# Patient Record
Sex: Male | Born: 1950 | Race: White | Hispanic: No | Marital: Married | Smoking: Never smoker
Health system: Southern US, Community
[De-identification: ages and names within clinical notes are randomized; demographics above are authoritative.]

## PROBLEM LIST (undated history)

## (undated) DIAGNOSIS — Z87442 Personal history of urinary calculi: Secondary | ICD-10-CM

## (undated) DIAGNOSIS — J189 Pneumonia, unspecified organism: Secondary | ICD-10-CM

## (undated) DIAGNOSIS — J45909 Unspecified asthma, uncomplicated: Secondary | ICD-10-CM

## (undated) HISTORY — PX: OTHER SURGICAL HISTORY: SHX169

## (undated) HISTORY — PX: COLONOSCOPY: SHX174

---

## 1998-05-23 ENCOUNTER — Emergency Department (HOSPITAL_COMMUNITY): Admission: EM | Admit: 1998-05-23 | Discharge: 1998-05-23 | Payer: Self-pay | Admitting: Emergency Medicine

## 2005-01-13 ENCOUNTER — Emergency Department (HOSPITAL_COMMUNITY): Admission: EM | Admit: 2005-01-13 | Discharge: 2005-01-13 | Payer: Self-pay | Admitting: Emergency Medicine

## 2005-01-21 ENCOUNTER — Ambulatory Visit: Payer: Self-pay | Admitting: Internal Medicine

## 2005-01-25 ENCOUNTER — Emergency Department (HOSPITAL_COMMUNITY): Admission: EM | Admit: 2005-01-25 | Discharge: 2005-01-25 | Payer: Self-pay | Admitting: Emergency Medicine

## 2005-02-01 ENCOUNTER — Ambulatory Visit: Payer: Self-pay | Admitting: Internal Medicine

## 2005-02-25 ENCOUNTER — Ambulatory Visit: Payer: Self-pay | Admitting: Internal Medicine

## 2013-06-22 ENCOUNTER — Emergency Department (HOSPITAL_COMMUNITY): Payer: Worker's Compensation

## 2013-06-22 ENCOUNTER — Emergency Department (HOSPITAL_COMMUNITY)
Admission: EM | Admit: 2013-06-22 | Discharge: 2013-06-23 | Disposition: A | Discharge status: Home / Self Care | Payer: Worker's Compensation | Attending: Emergency Medicine | Admitting: Emergency Medicine

## 2013-06-22 ENCOUNTER — Encounter (HOSPITAL_COMMUNITY): Payer: Self-pay | Admitting: Emergency Medicine

## 2013-06-22 DIAGNOSIS — S61409A Unspecified open wound of unspecified hand, initial encounter: Secondary | ICD-10-CM | POA: Insufficient documentation

## 2013-06-22 DIAGNOSIS — Y9389 Activity, other specified: Secondary | ICD-10-CM | POA: Insufficient documentation

## 2013-06-22 DIAGNOSIS — Z23 Encounter for immunization: Secondary | ICD-10-CM | POA: Insufficient documentation

## 2013-06-22 DIAGNOSIS — J45909 Unspecified asthma, uncomplicated: Secondary | ICD-10-CM | POA: Insufficient documentation

## 2013-06-22 DIAGNOSIS — W268XXA Contact with other sharp object(s), not elsewhere classified, initial encounter: Secondary | ICD-10-CM | POA: Insufficient documentation

## 2013-06-22 DIAGNOSIS — Y929 Unspecified place or not applicable: Secondary | ICD-10-CM | POA: Insufficient documentation

## 2013-06-22 DIAGNOSIS — S61411A Laceration without foreign body of right hand, initial encounter: Secondary | ICD-10-CM

## 2013-06-22 HISTORY — DX: Unspecified asthma, uncomplicated: J45.909

## 2013-06-22 MED ORDER — TETANUS-DIPHTH-ACELL PERTUSSIS 5-2.5-18.5 LF-MCG/0.5 IM SUSP
0.5000 mL | Freq: Once | INTRAMUSCULAR | Status: AC
  Administered 2013-06-22: 0.5 mL via INTRAMUSCULAR
  Filled 2013-06-22: qty 0.5

## 2013-06-22 NOTE — ED Notes (Signed)
MD at bedside. 

## 2013-06-22 NOTE — ED Provider Notes (Signed)
CSN: [id number]     Arrival date & time 06/22/13  2218 History   First MD Initiated Contact with Patient 06/22/13 2303     Chief Complaint  Patient presents with  . Extremity Laceration     (Consider location/radiation/quality/duration/timing/severity/associated sxs/prior Treatment) The history is provided by the patient and medical records. No language interpreter was used.    Gregory Mcdowell is a 63 y.o. male  with a hx of asthma presents to the Emergency Department complaining of acute laceration to the right hand occuring 1 hour PTA.  Pt reports he was working on a shower when he cut his hand on a piece of metal he couldn't see.  He reports his tetanus is not UTD. Associated symptoms include pain at the site.  Hemostasis achieved prior to arrival. Nothing makes it better or worse.  Pt denies fever, chills, numbness, weakness.    Past Medical History  Diagnosis Date  . Asthma    Past Surgical History  Procedure Laterality Date  . Leg injury     History reviewed. No pertinent family history. History  Substance Use Topics  . Smoking status: Never Smoker   . Smokeless tobacco: Never Used  . Alcohol Use: Yes     Comment: ocassionally    Review of Systems  Constitutional: Negative for fever.  Gastrointestinal: Negative for nausea and vomiting.  Skin: Positive for wound.  Allergic/Immunologic: Negative for immunocompromised state.  Neurological: Negative for weakness and numbness.  Hematological: Does not bruise/bleed easily.  Psychiatric/Behavioral: The patient is not nervous/anxious.       Allergies  Review of patient's allergies indicates no known allergies.  Home Medications  No current outpatient prescriptions on file. BP 150/90  Pulse 68  Temp(Src) 97.5 F (36.4 C) (Oral)  Resp 18  Wt 232 lb 4.8 oz (105.371 kg)  SpO2 95% Physical Exam  Nursing note and vitals reviewed. Constitutional: He is oriented to person, place, and time. He appears well-developed and  well-nourished. No distress.  HENT:  Head: Normocephalic and atraumatic.  Eyes: Conjunctivae are normal. No scleral icterus.  Neck: Normal range of motion.  Cardiovascular: Normal rate, regular rhythm, normal heart sounds and intact distal pulses.   No murmur heard. Capillary refill < 3 sec  Pulmonary/Chest: Effort normal and breath sounds normal. No respiratory distress.  Musculoskeletal: Normal range of motion. He exhibits no edema.  ROM: full ROM of all fingers of the right hand  Neurological: He is alert and oriented to person, place, and time.  Sensation: intact to dull and sharp Strength: 4/5 strength in the thumb with resisted flexion and extension  Skin: Skin is warm and dry. He is not diaphoretic.  8cm laceration to the right thenar eminence - very deep 6cm laceration to the medial side of the palm   Psychiatric: He has a normal mood and affect.    ED Course  Procedures (including critical care time) Labs Review Labs Reviewed - No data to display Imaging Review No results found.  EKG Interpretation   None       MDM   Final diagnoses:  None    [name] presents with large laceration to the thenar eminence and to the medial palm.  X-ray without foreign body or fracture.  Tetanus updated.  I personally reviewed the imaging tests through PACS system  I reviewed available ER/hospitalization records through the EMR  Discussed with Dr. Amanda Pea who will repair the laceration tonight in the ED.  Pain control given and  Ancef 2g IV.    Pt to be d/c with percocet and keflex.    It has been determined that no acute conditions requiring further emergency intervention are present at this time. The patient/guardian have been advised of the diagnosis and plan. We have discussed signs and symptoms that warrant return to the ED, such as changes or worsening in symptoms.   Vital signs are stable at discharge.   BP 150/90  Pulse 68  Temp(Src) 97.5 F (36.4 C) (Oral)   Resp 18  Wt 232 lb 4.8 oz (105.371 kg)  SpO2 95%  Patient/guardian has voiced understanding and agreed to follow-up with the PCP or specialist.        Dierdre ForthHannah Shirrell Solinger, PA-C 06/23/13 0250

## 2013-06-22 NOTE — ED Notes (Signed)
Patient was working on a shower and cut his right hand  Large laceration to base of right thumb (moves thumb well) and laceration to the palm of his right hand

## 2013-06-23 ENCOUNTER — Encounter (HOSPITAL_COMMUNITY): Payer: Self-pay

## 2013-06-23 MED ORDER — OXYCODONE-ACETAMINOPHEN 5-325 MG PO TABS: 1.0000 | ORAL_TABLET | ORAL | Status: DC | PRN

## 2013-06-23 MED ORDER — CEPHALEXIN 500 MG PO CAPS: 500.0000 mg | ORAL_CAPSULE | Freq: Four times a day (QID) | ORAL | Status: DC

## 2013-06-23 MED ORDER — OXYCODONE-ACETAMINOPHEN 5-325 MG PO TABS
ORAL_TABLET | ORAL | Status: AC
  Filled 2013-06-23: qty 1

## 2013-06-23 NOTE — Consult Note (Signed)
  See Dictation #[id number] Amanda PeaGramig MD

## 2013-06-23 NOTE — ED Notes (Signed)
See downtime charting. 

## 2013-06-23 NOTE — ED Notes (Signed)
Per C.C. OR RN: patient hand laceration to right hand. Cleaned normal saline repair muscle tendon and nerve. Repair sutures to close laceration. per Md. Protocol. Assisted Dr. Leata MouseGramgic with this minor procedure @bedside . Dressing xeroform,4x4,kerlix, and splint applied.  Patient tolerated well with out difficulty.

## 2013-06-23 NOTE — Discharge Instructions (Signed)
1. Medications: percocet, keflex, usual home medications 2. Treatment: rest, drink plenty of fluids,  3. Follow Up: Please followup with Dr. Amanda Pea as directed  Laceration Care, Adult A laceration is a cut or lesion that goes through all layers of the skin and into the tissue just beneath the skin. TREATMENT  Some lacerations may not require closure. Some lacerations may not be able to be closed due to an increased risk of infection. It is important to see your caregiver as soon as possible after an injury to minimize the risk of infection and maximize the opportunity for successful closure. If closure is appropriate, pain medicines may be given, if needed. The wound will be cleaned to help prevent infection. Your caregiver will use stitches (sutures), staples, wound glue (adhesive), or skin adhesive strips to repair the laceration. These tools bring the skin edges together to allow for faster healing and a better cosmetic outcome. However, all wounds will heal with a scar. Once the wound has healed, scarring can be minimized by covering the wound with sunscreen during the day for 1 full year. HOME CARE INSTRUCTIONS  For sutures or staples:  Keep the wound clean and dry.  If you were given a bandage (dressing), you should change it at least once a day. Also, change the dressing if it becomes wet or dirty, or as directed by your caregiver.  Wash the wound with soap and water 2 times a day. Rinse the wound off with water to remove all soap. Pat the wound dry with a clean towel.  After cleaning, apply a thin layer of the antibiotic ointment as recommended by your caregiver. This will help prevent infection and keep the dressing from sticking.  You may shower as usual after the first 24 hours. Do not soak the wound in water until the sutures are removed.  Only take over-the-counter or prescription medicines for pain, discomfort, or fever as directed by your caregiver.  Get your sutures or staples  removed as directed by your caregiver. For skin adhesive strips:  Keep the wound clean and dry.  Do not get the skin adhesive strips wet. You may bathe carefully, using caution to keep the wound dry.  If the wound gets wet, pat it dry with a clean towel.  Skin adhesive strips will fall off on their own. You may trim the strips as the wound heals. Do not remove skin adhesive strips that are still stuck to the wound. They will fall off in time. For wound adhesive:  You may briefly wet your wound in the shower or bath. Do not soak or scrub the wound. Do not swim. Avoid periods of heavy perspiration until the skin adhesive has fallen off on its own. After showering or bathing, gently pat the wound dry with a clean towel.  Do not apply liquid medicine, cream medicine, or ointment medicine to your wound while the skin adhesive is in place. This may loosen the film before your wound is healed.  If a dressing is placed over the wound, be careful not to apply tape directly over the skin adhesive. This may cause the adhesive to be pulled off before the wound is healed.  Avoid prolonged exposure to sunlight or tanning lamps while the skin adhesive is in place. Exposure to ultraviolet light in the first year will darken the scar.  The skin adhesive will usually remain in place for 5 to 10 days, then naturally fall off the skin. Do not pick at the adhesive  film. You may need a tetanus shot if:  You cannot remember when you had your last tetanus shot.  You have never had a tetanus shot. If you get a tetanus shot, your arm may swell, get red, and feel warm to the touch. This is common and not a problem. If you need a tetanus shot and you choose not to have one, there is a rare chance of getting tetanus. Sickness from tetanus can be serious. SEEK MEDICAL CARE IF:   You have redness, swelling, or increasing pain in the wound.  You see a red line that goes away from the wound.  You have  yellowish-white fluid (pus) coming from the wound.  You have a fever.  You notice a bad smell coming from the wound or dressing.  Your wound breaks open before or after sutures have been removed.  You notice something coming out of the wound such as wood or glass.  Your wound is on your hand or foot and you cannot move a finger or toe. SEEK IMMEDIATE MEDICAL CARE IF:   Your pain is not controlled with prescribed medicine.  You have severe swelling around the wound causing pain and numbness or a change in color in your arm, hand, leg, or foot.  Your wound splits open and starts bleeding.  You have worsening numbness, weakness, or loss of function of any joint around or beyond the wound.  You develop painful lumps near the wound or on the skin anywhere on your body. MAKE SURE YOU:   Understand these instructions.  Will watch your condition.  Will get help right away if you are not doing well or get worse. Document Released: 04/29/2005 Document Revised: 07/22/2011 Document Reviewed: 10/23/2010 [facility] Patient Information 2014 [place].  Keep bandage clean and dry.  Call for any problems.  No smoking.  Criteria for driving a car: you should be off your pain medicine for 7-8 hours, able to drive one handed(confident), thinking clearly and feeling able in your judgement to drive. Continue elevation as it will decrease swelling.  If instructed by MD move your fingers within the confines of the bandage/splint.  Use ice if instructed by your MD. Call immediately for any sudden loss of feeling in your hand/arm or change in functional abilities of the extremity.   We recommend that you to take vitamin C 1000 mg a day to promote healing we also recommend that if you require her pain medicine that he take a stool softener to prevent constipation as most pain medicines will have constipation side effects. We recommend either Peri-Colace or Senokot and recommend that you also consider  adding MiraLAX to prevent the constipation affects from pain medicine if you are required to use them. These medicines are over the counter and maybe purchased at a local pharmacy.

## 2013-06-23 NOTE — ED Provider Notes (Signed)
Medical screening examination/treatment/procedure(s) were conducted as a shared visit with non-physician practitioner(s) and myself.  I personally evaluated the patient during the encounter.  EKG Interpretation   None       Patient presents with deep laceration to the thenar imminence of the right hand. Full range of motion of the thumb with no numbness. Given extensive laceration hand surgery was consult it. He seen the patient in the emergency department and repaired the injury. Patient followup as an outpatient with Dr. Lenore MannerGramig  Gardy Krystopher Kuenzel, MD 06/23/13 [phone]

## 2013-06-24 NOTE — Consult Note (Signed)
NAMAlthea Charon:  Khouri, Whalen                 ACCOUNT NO.:  [id number]  MEDICAL RECORD NO.:  [id number]  LOCATION:  D34C                         FACILITY:  [facility]  PHYSICIAN:  Dionne AnoWilliam M. Dhaval Woo, M.D.DATE OF BIRTH:  [birth year]  DATE OF CONSULTATION: DATE OF DISCHARGE:  06/23/2013                                CONSULTATION   HISTORY OF PRESENT ILLNESS:  I had a pleasure to see Drue Stageravid Donelan today. He is a pleasant male, who presents with large laceration to his right hand.  The patient was referred in from his job place.  He works at the [employer].  He has severe laceration to his right hand in 2 different location.  He has exposed muscle tendon and nerve in the right thenar region and dorsal radial aspect of his right thumb.  He also has a hypothenar laceration.  The patient was given a tetanus shot and antibiotics in the form of 2 g of Ancef at my discussion and direction.  He is alert and oriented.  He denies neck, back, chest, or abdominal pain.  He denies other injury tonight.  I was called around 1 a.m. and promptly came in to see the patient.  PAST MEDICAL HISTORY:  Reviewed.  PAST SURGICAL HISTORY:  reviewed.  MEDICINES:  Reviewed.  ALLERGIES:  No known drug allergies.  SOCIAL HISTORY:  He is a nonsmoker.  He is married.  He is here with a co-worker.  He is alert and oriented.  REVIEW OF SYSTEMS:  A 14-point review of systems is reviewed.  He has no significant abnormalities on 14-point review of systems in terms of current health ailments or problems.  I have reviewed this at length.  PHYSICAL EXAMINATION:  NECK AND BACK:  Nontender. CHEST:  Clear. ABDOMEN:  Nontender and nondistended and soft.     Dionne AnoWilliam M. Amanda PeaGramig, M.D.     [facility]  D:  06/23/2013  T:  06/24/2013  Job:  [id number]

## 2013-06-24 NOTE — Op Note (Signed)
NAMAlthea Charon:  Bouffard, Kellyn                 ACCOUNT NO.:  [id number]  MEDICAL RECORD NO.:  [id number]  LOCATION:  D34C                         FACILITY:  [facility]  PHYSICIAN:  Dionne AnoWilliam M. Megon Kalina, M.D.DATE OF BIRTH:  August 26, 1950  DATE OF PROCEDURE: DATE OF DISCHARGE:  06/23/2013                              OPERATIVE REPORT   PREOPERATIVE DIAGNOSIS: 1. Thenar laceration, 10 cm, with exposed tendon, nerve, and muscle. 2. Hypothenar laceration, 4 cm, skin and subcutaneous tissue involved.  POSTOPERATIVE DIAGNOSIS: 1. Thenar laceration, 10 cm, with exposed tendon, nerve, and muscle. 2. Hypothenar laceration, 4 cm, skin and subcutaneous tissue involved.  PROCEDURE: 1. Irrigation and debridement of thenar laceration.  This was     excisional debridement of skin and subcutaneous tissue, muscle,     tendon, and associated soft tissues including periosteum. 2. Separate irrigation and debridement less than 20 sq. cm, skin and     subcutaneous tissue, hypothenar laceration 4 cm. 3. Abductor pollicis brevis tendon repair with 4-0 FiberWire. 4. Superficial radial nerve repair with 8-0 nylon utilizing 4.0 loupe     magnification. 5. Complex wound closure at thenar space, 10 cm. 6. Simple wound closure, 4 cm region, hypothenar aspect, right hand.  SURGEON:  Dionne AnoWilliam M. Amanda PeaGramig, M.D.  COMPLICATIONS:  None.  ANESTHESIA:  Local field and peripheral nerve block.  TOURNIQUET TIME:  Zero.  INDICATIONS:  Please see H and P.  Gregory Mcdowell had an on-Gregory-job injury with severe laceration.  He desires to proceed with Gregory above-mentioned operative intervention.  He understands risks and benefits of bleeding, infection, damage to normal structures, and failure of surgery to accomplish its intended goals of relieving symptoms and restoring function.  With this in mind, he desires to proceed.  All questions have been encouraged and answered preoperatively.  OPERATIVE PROCEDURE:  Gregory Mcdowell was seen, consented, and  evaluated extensively and following this, underwent a very careful and cautious field block and superficial radial nerve block.  Following this, I performed irrigation and debridement of skin, subcutaneous tissue, muscle, and tendon about Gregory thenar laceration, which was 10 cm, this was an excisional debridement.  Following this, we performed excisional debridement of skin and subcutaneous tissue, hypothenar laceration, 4 cm.  This was an excisional debridement of 20 sq. cm or less.  Both debridements occurred with curette scissor tip and knife blade, and this was an excisional debridement in both areas.  These were 2 different debridements involving different layers and complexities.  Following this, I was sure to place 3 L of saline through all areas to clean Gregory wounds promptly and aggressively.  Following this, we then repaired Gregory hypothenar laceration with 5-0 Prolene without difficulty, which he tolerated well.  This was a 4 cm simple repair.  Following this, I exposed Gregory abductor pollicis brevis tendon and I performed an 8-strand repair with FiberWire utilizing a modified Kessler- Tajima stitch.  This was done without difficulty and was tension free.  Following this, we exposed superficial radial nerve and performed epineurial repair with 8-0 nylon.  Gregory Mcdowell tolerated this beautifully.  I was able to coapt Gregory nerve ends.  I freshened them and then used [employer] scissor with  8-0 nylon to coapt Gregory nerve ends.  Following coapting Gregory nerve ends, I then very carefully placed a venous wrap around Gregory dorsal aspect to try to prevent sensitivity in Gregory future.  He tolerated this very well.  We irrigated copiously and following this, Gregory Mcdowell then underwent closure of Gregory wound with Prolene.  Gregory Mcdowell tolerated this nicely, and there were no complicating features. Once Gregory wound was closed, he was dressed with Xeroform gauze and a thumb spica splint.  He will be  discharged home on antibiotics, pain medicine, and return to see [place] in 12-14 days.  I am going to go ahead and mobilize Gregory thenar region for 4 weeks to allow Gregory tendon to heal and then I will begin progressive interval motion.  I have discussed with him I would expect hopefully no painful neuromas, but it is difficult to say how much sensory return he will get in Gregory superficial radial nerve and he understands this.  I would expect some degree of sensory deficit about Gregory radial aspect of Gregory thumb long-term given his age and Gregory injury level.  Hopefully, he will be able to regain his strength and abduction power.  He had excellent refill, was awake, alert, and oriented at Gregory time of discharge and awake, alert, and oriented during all points and parts of Gregory procedure.  We will look forward to seeing him back in Gregory office as discussed and all questions of course have been encouraged and answered.     Dionne Ano. Amanda Pea, M.D.     [facility]  D:  06/23/2013  T:  06/24/2013  Job:  [id number]

## 2014-01-17 ENCOUNTER — Encounter (HOSPITAL_COMMUNITY): Payer: Self-pay | Admitting: Emergency Medicine

## 2014-01-17 ENCOUNTER — Emergency Department (HOSPITAL_COMMUNITY)
Admission: EM | Admit: 2014-01-17 | Discharge: 2014-01-17 | Disposition: A | Discharge status: Home / Self Care | Payer: 59 | Attending: Emergency Medicine | Admitting: Emergency Medicine

## 2014-01-17 ENCOUNTER — Emergency Department (HOSPITAL_COMMUNITY): Payer: 59

## 2014-01-17 DIAGNOSIS — Y9289 Other specified places as the place of occurrence of the external cause: Secondary | ICD-10-CM | POA: Insufficient documentation

## 2014-01-17 DIAGNOSIS — Y9389 Activity, other specified: Secondary | ICD-10-CM | POA: Insufficient documentation

## 2014-01-17 DIAGNOSIS — R6883 Chills (without fever): Secondary | ICD-10-CM | POA: Insufficient documentation

## 2014-01-17 DIAGNOSIS — IMO0002 Reserved for concepts with insufficient information to code with codable children: Secondary | ICD-10-CM | POA: Insufficient documentation

## 2014-01-17 DIAGNOSIS — Z79899 Other long term (current) drug therapy: Secondary | ICD-10-CM | POA: Insufficient documentation

## 2014-01-17 DIAGNOSIS — S99919A Unspecified injury of unspecified ankle, initial encounter: Principal | ICD-10-CM

## 2014-01-17 DIAGNOSIS — S99929A Unspecified injury of unspecified foot, initial encounter: Principal | ICD-10-CM

## 2014-01-17 DIAGNOSIS — M25461 Effusion, right knee: Secondary | ICD-10-CM

## 2014-01-17 DIAGNOSIS — J45909 Unspecified asthma, uncomplicated: Secondary | ICD-10-CM | POA: Insufficient documentation

## 2014-01-17 DIAGNOSIS — S8990XA Unspecified injury of unspecified lower leg, initial encounter: Secondary | ICD-10-CM | POA: Insufficient documentation

## 2014-01-17 DIAGNOSIS — M25561 Pain in right knee: Secondary | ICD-10-CM

## 2014-01-17 LAB — SYNOVIAL CELL COUNT + DIFF, W/ CRYSTALS
CRYSTALS FLUID: NONE SEEN
CRYSTALS FLUID: NONE SEEN
Eosinophils-Synovial: NONE SEEN % (ref 0–1)
Lymphocytes-Synovial Fld: 11 % (ref 0–20)
Lymphocytes-Synovial Fld: 10 % (ref 0–20)
Monocyte-Macrophage-Synovial Fluid: 6 % — ABNORMAL LOW (ref 50–90)
Monocyte-Macrophage-Synovial Fluid: 6 % — ABNORMAL LOW (ref 50–90)
NEUTROPHIL, SYNOVIAL: 83 % — AB (ref 0–25)
NEUTROPHIL, SYNOVIAL: 84 % — AB (ref 0–25)
WBC, SYNOVIAL: UNDETERMINED /mm3 (ref 0–200)
WBC, Synovial: 312 /mm3 — ABNORMAL HIGH (ref 0–200)

## 2014-01-17 LAB — CBC WITH DIFFERENTIAL/PLATELET
BASOS ABS: 0 10*3/uL (ref 0.0–0.1)
BASOS PCT: 0 % (ref 0–1)
EOS ABS: 0.1 10*3/uL (ref 0.0–0.7)
EOS PCT: 1 % (ref 0–5)
HCT: 40.8 % (ref 39.0–52.0)
Hemoglobin: 14.4 g/dL (ref 13.0–17.0)
LYMPHS ABS: 1.1 10*3/uL (ref 0.7–4.0)
Lymphocytes Relative: 9 % — ABNORMAL LOW (ref 12–46)
MCH: 30.8 pg (ref 26.0–34.0)
MCHC: 35.3 g/dL (ref 30.0–36.0)
MCV: 87.4 fL (ref 78.0–100.0)
MONOS PCT: 10 % (ref 3–12)
Monocytes Absolute: 1.2 10*3/uL — ABNORMAL HIGH (ref 0.1–1.0)
NEUTROS ABS: 9.9 10*3/uL — AB (ref 1.7–7.7)
Neutrophils Relative %: 80 % — ABNORMAL HIGH (ref 43–77)
PLATELETS: 241 10*3/uL (ref 150–400)
RBC: 4.67 MIL/uL (ref 4.22–5.81)
RDW: 13 % (ref 11.5–15.5)
WBC: 12.4 10*3/uL — ABNORMAL HIGH (ref 4.0–10.5)

## 2014-01-17 LAB — BASIC METABOLIC PANEL
ANION GAP: 13 (ref 5–15)
BUN: 13 mg/dL (ref 6–23)
CALCIUM: 8.8 mg/dL (ref 8.4–10.5)
CHLORIDE: 98 meq/L (ref 96–112)
CO2: 24 mEq/L (ref 19–32)
Creatinine, Ser: 0.87 mg/dL (ref 0.50–1.35)
GFR calc Af Amer: >90 mL/min (ref >90)
GFR calc non Af Amer: >90 mL/min (ref >90)
GLUCOSE: 105 mg/dL — AB (ref 70–99)
Potassium: 4.8 mEq/L (ref 3.7–5.3)
SODIUM: 135 meq/L — AB (ref 137–147)

## 2014-01-17 LAB — GRAM STAIN
Special Requests: NORMAL
Special Requests: NORMAL

## 2014-01-17 LAB — SEDIMENTATION RATE: SED RATE: 18 mm/h — AB (ref 0–16)

## 2014-01-17 MED ORDER — BUTAMBEN-TETRACAINE-BENZOCAINE 2-2-14 % EX AERO
1.0000 | INHALATION_SPRAY | Freq: Once | CUTANEOUS | Status: DC
  Filled 2014-01-17: qty 56

## 2014-01-17 MED ORDER — DOXYCYCLINE HYCLATE 100 MG PO CAPS: 100.0000 mg | ORAL_CAPSULE | Freq: Two times a day (BID) | ORAL | Status: DC

## 2014-01-17 MED ORDER — OXYCODONE-ACETAMINOPHEN 5-325 MG PO TABS: 1.0000 | ORAL_TABLET | Freq: Four times a day (QID) | ORAL | Status: DC | PRN

## 2014-01-17 MED ORDER — OXYCODONE-ACETAMINOPHEN 5-325 MG PO TABS
1.0000 | ORAL_TABLET | Freq: Once | ORAL | Status: AC
  Administered 2014-01-17: 1 via ORAL
  Filled 2014-01-17: qty 1

## 2014-01-17 MED ORDER — NAPROXEN 500 MG PO TABS: 500.0000 mg | ORAL_TABLET | Freq: Two times a day (BID) | ORAL | Status: DC | PRN

## 2014-01-17 NOTE — ED Notes (Signed)
Per pt sts that 1 week ago he turned in bed and felt a pop in his right knee. Since progressively more pain.

## 2014-01-17 NOTE — ED Notes (Signed)
Second synovial joint fluid walked to lab; <51mL of red fluid noted in syringe; PA aware specimen may not be sufficient for testing

## 2014-01-17 NOTE — ED Provider Notes (Signed)
CSN: [id number]     Arrival date & time 01/17/14  1431 History   First MD Initiated Contact with Patient 01/17/14 1604     This chart was scribed for non-physician practitioner, Zacarias Pontes PA-C working with Orpah Greek, * by Forrestine Him, ED Scribe. This patient was seen in room TR11C/TR11C and the patient's care was started at 4:11 PM.   Chief Complaint  Patient presents with  . Knee Pain   Patient is a 63 y.o. male presenting with knee pain. The history is provided by the patient. No language interpreter was used.  Knee Pain Location:  Knee Time since incident:  1 week Knee location:  R knee Pain details:    Quality:  Sharp   Radiates to:  Does not radiate   Severity:  Moderate   Onset quality:  Sudden   Timing:  Constant   Progression:  Unchanged Chronicity:  New Dislocation: no   Foreign body present:  No foreign bodies Prior injury to area:  No Relieved by:  Rest Worsened by:  Extension and flexion Ineffective treatments:  None tried Associated symptoms: decreased ROM, stiffness and swelling   Associated symptoms: no fever, no muscle weakness, no numbness and no tingling     HPI Comments: Gregory Mcdowell is a 63 y.o. male with a PMHx of asthma who presents to the Emergency Department complaining of R knee pain x 1 week that is unchanged. He also reports associated warmth to the knee. Pt states he was in bed when he twisted his leg over resulting in him hearing a "pop". Now c/o constant, moderate, nonradiating pain and swelling to the knee as well as redness. Gregory Mcdowell describes pain as sharp with movement. Pain is exacerbated with movement/ambulation and alleviated with rest. He has tried OTC Ibuprofen with mild improvement for symptoms. Pt also reports mild chills last night. He denies any known fever, SOB, CP, nausea, vomiting, or abdominal pain. No numbness, paresthesia, or loss of sensation.  He denies a history of gout. Reports eating two double cheese  hamburgers from [employer]. No known skin injury to the area. He has no other pertinent past medical history. No other concerns this visit.   Past Medical History  Diagnosis Date  . Asthma    Past Surgical History  Procedure Laterality Date  . Leg injury     History reviewed. No pertinent family history. History  Substance Use Topics  . Smoking status: Never Smoker   . Smokeless tobacco: Never Used  . Alcohol Use: Yes     Comment: ocassionally    Review of Systems  Constitutional: Positive for chills. Negative for fever.  Respiratory: Negative for shortness of breath.   Cardiovascular: Negative for chest pain and leg swelling.  Gastrointestinal: Negative for nausea, vomiting, abdominal pain and diarrhea.  Musculoskeletal: Positive for arthralgias, gait problem (difficult to bear weight), joint swelling and stiffness. Negative for myalgias.  Skin: Positive for color change. Negative for wound.  Allergic/Immunologic: Negative for immunocompromised state.  Neurological: Negative for weakness and numbness.  Hematological: Does not bruise/bleed easily.  Psychiatric/Behavioral: Negative for confusion.  A complete 10 system review of systems was obtained and all systems are negative except as noted in the HPI and PMH.     Allergies  Review of patient's allergies indicates no known allergies.  Home Medications   Prior to Admission medications   Medication Sig Start Date End Date Taking? Authorizing Provider  cephALEXin (KEFLEX) 500 MG capsule Take 1 capsule (500  mg total) by mouth 4 (four) times daily. 06/23/13   Hannah Muthersbaugh, PA-C  oxyCODONE-acetaminophen (PERCOCET/ROXICET) 5-325 MG per tablet Take 1-2 tablets by mouth every 4 (four) hours as needed for severe pain. 06/23/13   Hannah Muthersbaugh, PA-C   Triage Vitals: BP 125/72  Pulse 71  Temp(Src) 98.3 F (36.8 C)  Resp 18  SpO2 96%   Physical Exam  Nursing note and vitals reviewed. Constitutional: He is oriented to  person, place, and time. Vital signs are normal. He appears well-developed and well-nourished. No distress.  Afebrile, VSS, NAD  HENT:  Head: Normocephalic and atraumatic.  Mouth/Throat: Mucous membranes are normal.  Eyes: Conjunctivae and EOM are normal.  Neck: Normal range of motion. Neck supple.  Cardiovascular: Normal rate and intact distal pulses.   Distal pulses intact  Pulmonary/Chest: Effort normal. No respiratory distress.  Abdominal: Normal appearance. He exhibits no distension.  Musculoskeletal:       Right knee: He exhibits decreased range of motion (extension limited secondary to pain, full flexion), swelling, effusion, erythema and abnormal patellar mobility (ballottement). He exhibits normal alignment, no LCL laxity, no bony tenderness and no MCL laxity. Tenderness found. Patellar tendon tenderness noted.       Legs: Decreased extension of R knee secondary to pain, full flexion. Warmth and erythema noted to the joint extending into the distal thigh and over the patella, with TTP over the patella and thigh. +ballottement. +effusion. No bony TTP or laxity w/ varus/valgus stress. No abnormal alignment noted. Strength 5/5 with dorsiflexion/plantarflexion, sensation grossly intact  Neurological: He is alert and oriented to person, place, and time. He has normal strength. No sensory deficit. Gait (antalgic) abnormal.  Skin: Skin is warm, dry and intact. There is erythema.  Erythema and warmth over R knee  Psychiatric: He has a normal mood and affect.    ED Course  ARTHOCENTESIS Date/Time: 01/17/2014 6:18 PM Performed by: Zacarias Pontes STRUPP Authorized by: Corine Shelter Consent: Verbal consent obtained. Risks and benefits: risks, benefits and alternatives were discussed Consent given by: patient Patient understanding: patient states understanding of the procedure being performed Patient consent: the patient's understanding of the procedure matches  consent given Patient identity confirmed: verbally with patient Indications: possible septic joint and joint swelling  Body area: knee Joint: right knee Local anesthesia used: yes Local anesthetic: lidocaine spray Patient sedated: no Preparation: Patient was prepped and draped in the usual sterile fashion. Needle gauge: 18 G Ultrasound guidance: no Approach: superior Aspirate: bloody Aspirate amount: 1 ml Patient tolerance: Patient tolerated the procedure well with no immediate complications.  ARTHOCENTESIS Date/Time: 01/18/2014 8:56 PM Performed by: Zacarias Pontes STRUPP Authorized by: Corine Shelter Consent: Verbal consent obtained. Risks and benefits: risks, benefits and alternatives were discussed Consent given by: patient Patient understanding: patient states understanding of the procedure being performed Patient consent: the patient's understanding of the procedure matches consent given Patient identity confirmed: verbally with patient Indications: diagnostic evaluation and possible septic joint  Body area: knee Joint: right knee Local anesthesia used: yes Local anesthetic: lidocaine spray Patient sedated: no Preparation: Patient was prepped and draped in the usual sterile fashion. Needle gauge: 18 G Ultrasound guidance: no Approach: medial Aspirate: bloody Aspirate amount: 0.2 ml Patient tolerance: Patient tolerated the procedure well with no immediate complications. Comments: Suprapatellar bursa aspiration, very minimal amount of fluid obtained after attempting for approx 25 minutes, bringing the needle into different areas to attempt to access bursa. Bloody fluid aspirated   (including critical care time)  DIAGNOSTIC STUDIES: Oxygen Saturation is 96% on RA, Adequate by my interpretation.    COORDINATION OF CARE: 4:11 PM- Will order DG knee complete 4 views R. Will draw fluid from knee and send for evaluation. Discussed treatment plan with  pt at bedside and pt agreed to plan.     Labs Review Labs Reviewed - No data to display  Imaging Review Dg Knee Complete 4 Views Right  01/17/2014   CLINICAL DATA:  Right knee popping sensation  EXAM: RIGHT KNEE - COMPLETE 4+ VIEW  COMPARISON:  None.  FINDINGS: There is no evidence of fracture or dislocation. There is no evidence of arthropathy or other focal bone abnormality. Soft tissues are unremarkable. Mild tricompartmental degenerative change is noted. Trace suprapatellar fluid is identified.  IMPRESSION: No acute osseous abnormality.   Electronically Signed   By: Conchita Paris M.D.   On: 01/17/2014 15:44     EKG Interpretation None      MDM   Final diagnoses:  Knee pain, acute, right  Knee swelling, right    62y/o male with a painful swollen erythematous and warm right knee concerning for septic joint vs gout. X-ray showing suprapatellar swelling but effusion palpated within joint on exam. Attempted joint tap, which was bloody, sent for analysis and cell count showed no crystals but couldn't determine WBC due to clots, stain showed abundant WBC but no organisms. CBC showing WBC 12.4, ESR 18. Given that fluid was bloody, could indicate traumatic injury to knee, but no obvious mechanism. Discussed with ortho Dr. Lorin Mercy who advised me to perform suprapatellar tap, and advised on giving doxy with knee immobilizer and sending him to the office tomorrow for further eval. After several attempts with different angulations of needle in various locations, unable to get much out of suprapatellar bursa. Sent for analysis but did not await results, rx given for pain meds and doxy, knee immobilizer given. Discussed seeing Dr. Lorin Mercy tomorrow. I explained the diagnosis and have given explicit precautions to return to the ER including for any other new or worsening symptoms. The patient understands and accepts the medical plan as it's been dictated and I have answered their questions. Discharge  instructions concerning home care and prescriptions have been given. The patient is STABLE and is discharged to home in good condition.   I personally performed the services described in this documentation, which was scribed in my presence. The recorded information has been reviewed and is accurate.  BP 108/68  Pulse 70  Temp(Src) 98.2 F (36.8 C) (Oral)  Resp 18  SpO2 98%  Meds ordered this encounter  Medications  . oxyCODONE-acetaminophen (PERCOCET/ROXICET) 5-325 MG per tablet 1 tablet    Sig:   . oxyCODONE-acetaminophen (PERCOCET) 5-325 MG per tablet    Sig: Take 1-2 tablets by mouth every 6 (six) hours as needed for severe pain.    Dispense:  10 tablet    Refill:  0    Order Specific Question:  Supervising Provider    Answer:  Noemi Chapel D [[id number]]  . naproxen (NAPROSYN) 500 MG tablet    Sig: Take 1 tablet (500 mg total) by mouth 2 (two) times daily as needed for mild pain, moderate pain or headache (TAKE WITH MEALS.).    Dispense:  20 tablet    Refill:  0    Order Specific Question:  Supervising Provider    Answer:  Noemi Chapel D [[id number]]  . doxycycline (VIBRAMYCIN) 100 MG capsule    Sig: Take 1 capsule (100  mg total) by mouth 2 (two) times daily. One po bid x 7 days    Dispense:  14 capsule    Refill:  0    Order Specific Question:  Supervising Provider    Answer:  Johnna Acosta [[id number]]     [place], PA-C 01/18/14 [phone]

## 2014-01-17 NOTE — ED Notes (Signed)
Spoke to [place] about second synovial fluid sample; lab asking which tests are priority for second sample. Cultures and cell count are to be completed as first and second priority

## 2014-01-17 NOTE — Progress Notes (Signed)
Orthopedic Tech Progress Note Patient Details:  Gregory Mcdowell 1950/08/02 [id number]  Ortho Devices Type of Ortho Device: Knee Immobilizer Ortho Device/Splint Location: rle Ortho Device/Splint Interventions: Application   Nikki Dom 01/17/2014, 9:14 PM

## 2014-01-17 NOTE — Discharge Instructions (Signed)
Wear knee immobilizer until you see Dr. Ophelia Charter tomorrow. Use crutches as needed for comfort. Ice and elevate knee throughout the day. Alternate between naprosyn and percocet for pain relief. Do not drive or operate machinery with pain medication use. Call orthopedic follow up tomorrow to schedule followup appointment tomorrow. Take your antibiotic as directed. Return to the ER for changes or worsening symptoms   Arthritis, Nonspecific Arthritis is inflammation of a joint. This usually means pain, redness, warmth or swelling are present. One or more joints may be involved. There are a number of types of arthritis. Your caregiver may not be able to tell what type of arthritis you have right away. CAUSES  The most common cause of arthritis is the wear and tear on the joint (osteoarthritis). This causes damage to the cartilage, which can break down over time. The knees, hips, back and neck are most often affected by this type of arthritis. Other types of arthritis and common causes of joint pain include:  Sprains and other injuries near the joint. Sometimes minor sprains and injuries cause pain and swelling that develop hours later.  Rheumatoid arthritis. This affects hands, feet and knees. It usually affects both sides of your body at the same time. It is often associated with chronic ailments, fever, weight loss and general weakness.  Crystal arthritis. Gout and pseudo gout can cause occasional acute severe pain, redness and swelling in the foot, ankle, or knee.  Infectious arthritis. Bacteria can get into a joint through a break in overlying skin. This can cause infection of the joint. Bacteria and viruses can also spread through the blood and affect your joints.  Drug, infectious and allergy reactions. Sometimes joints can become mildly painful and slightly swollen with these types of illnesses. SYMPTOMS   Pain is the main symptom.  Your joint or joints can also be red, swollen and warm or hot  to the touch.  You may have a fever with certain types of arthritis, or even feel overall ill.  The joint with arthritis will hurt with movement. Stiffness is present with some types of arthritis. DIAGNOSIS  Your caregiver will suspect arthritis based on your description of your symptoms and on your exam. Testing may be needed to find the type of arthritis:  Blood and sometimes urine tests.  X-ray tests and sometimes CT or MRI scans.  Removal of fluid from the joint (arthrocentesis) is done to check for bacteria, crystals or other causes. Your caregiver (or a specialist) will numb the area over the joint with a local anesthetic, and use a needle to remove joint fluid for examination. This procedure is only minimally uncomfortable.  Even with these tests, your caregiver may not be able to tell what kind of arthritis you have. Consultation with a specialist (rheumatologist) may be helpful. TREATMENT  Your caregiver will discuss with you treatment specific to your type of arthritis. If the specific type cannot be determined, then the following general recommendations may apply. Treatment of severe joint pain includes:  Rest.  Elevation.  Anti-inflammatory medication (for example, ibuprofen) may be prescribed. Avoiding activities that cause increased pain.  Only take over-the-counter or prescription medicines for pain and discomfort as recommended by your caregiver.  Cold packs over an inflamed joint may be used for 10 to 15 minutes every hour. Hot packs sometimes feel better, but do not use overnight. Do not use hot packs if you are diabetic without your caregiver's permission.  A cortisone shot into arthritic joints may help  reduce pain and swelling.  Any acute arthritis that gets worse over the next 1 to 2 days needs to be looked at to be sure there is no joint infection. Long-term arthritis treatment involves modifying activities and lifestyle to reduce joint stress jarring. This can  include weight loss. Also, exercise is needed to nourish the joint cartilage and remove waste. This helps keep the muscles around the joint strong. HOME CARE INSTRUCTIONS   Do not take aspirin to relieve pain if gout is suspected. This elevates uric acid levels.  Only take over-the-counter or prescription medicines for pain, discomfort or fever as directed by your caregiver.  Rest the joint as much as possible.  If your joint is swollen, keep it elevated.  Use crutches if the painful joint is in your leg.  Drinking plenty of fluids may help for certain types of arthritis.  Follow your caregiver's dietary instructions.  Try low-impact exercise such as:  Swimming.  Water aerobics.  Biking.  Walking.  Morning stiffness is often relieved by a warm shower.  Put your joints through regular range-of-motion. SEEK MEDICAL CARE IF:   You do not feel better in 24 hours or are getting worse.  You have side effects to medications, or are not getting better with treatment. SEEK IMMEDIATE MEDICAL CARE IF:   You have a fever.  You develop severe joint pain, swelling or redness.  Many joints are involved and become painful and swollen.  There is severe back pain and/or leg weakness.  You have loss of bowel or bladder control. Document Released: 06/06/2004 Document Revised: 07/22/2011 Document Reviewed: 06/22/2008 [facility] Patient Information 2015 [place], [place]. This information is not intended to replace advice given to you by your health care provider. Make sure you discuss any questions you have with your health care provider.  Cryotherapy Cryotherapy is when you put ice on your injury. Ice helps lessen pain and puffiness (swelling) after an injury. Ice works the best when you start using it in the first 24 to 48 hours after an injury. HOME CARE  Put a dry or damp towel between the ice pack and your skin.  You may press gently on the ice pack.  Leave the ice on for no more  than 10 to 20 minutes at a time.  Check your skin after 5 minutes to make sure your skin is okay.  Rest at least 20 minutes between ice pack uses.  Stop using ice when your skin loses feeling (numbness).  Do not use ice on someone who cannot tell you when it hurts. This includes small children and people with memory problems (dementia). GET HELP RIGHT AWAY IF:  You have white spots on your skin.  Your skin turns blue or pale.  Your skin feels waxy or hard.  Your puffiness gets worse. MAKE SURE YOU:   Understand these instructions.  Will watch your condition.  Will get help right away if you are not doing well or get worse. Document Released: 10/16/2007 Document Revised: 07/22/2011 Document Reviewed: 12/20/2010 [facility] Patient Information 2015 [place], [place]. This information is not intended to replace advice given to you by your health care provider. Make sure you discuss any questions you have with your health care provider.

## 2014-01-18 NOTE — ED Provider Notes (Signed)
Medical screening examination/treatment/procedure(s) were performed by non-physician practitioner and as supervising physician I was immediately available for consultation/collaboration.   EKG Interpretation None        Gilda Crease, MD 01/18/14 1501

## 2014-01-21 LAB — BODY FLUID CULTURE
CULTURE: NO GROWTH
Culture: NO GROWTH
SPECIAL REQUESTS: NORMAL
Special Requests: NORMAL

## 2014-10-16 ENCOUNTER — Emergency Department (HOSPITAL_COMMUNITY): Payer: Commercial Managed Care - PPO

## 2014-10-16 ENCOUNTER — Emergency Department (HOSPITAL_COMMUNITY)
Admission: EM | Admit: 2014-10-16 | Discharge: 2014-10-16 | Disposition: A | Discharge status: Home / Self Care | Payer: Commercial Managed Care - PPO | Attending: Emergency Medicine | Admitting: Emergency Medicine

## 2014-10-16 ENCOUNTER — Encounter (HOSPITAL_COMMUNITY): Payer: Self-pay | Admitting: Emergency Medicine

## 2014-10-16 DIAGNOSIS — R55 Syncope and collapse: Secondary | ICD-10-CM | POA: Diagnosis present

## 2014-10-16 DIAGNOSIS — R42 Dizziness and giddiness: Secondary | ICD-10-CM | POA: Diagnosis not present

## 2014-10-16 DIAGNOSIS — J45909 Unspecified asthma, uncomplicated: Secondary | ICD-10-CM | POA: Insufficient documentation

## 2014-10-16 LAB — BASIC METABOLIC PANEL
Anion gap: 11 (ref 5–15)
BUN: 12 mg/dL (ref 6–20)
CO2: 24 mmol/L (ref 22–32)
Calcium: 8.6 mg/dL — ABNORMAL LOW (ref 8.9–10.3)
Chloride: 103 mmol/L (ref 101–111)
Creatinine, Ser: 1.08 mg/dL (ref 0.61–1.24)
Glucose, Bld: 116 mg/dL — ABNORMAL HIGH (ref 65–99)
Potassium: 3.7 mmol/L (ref 3.5–5.1)
Sodium: 138 mmol/L (ref 135–145)

## 2014-10-16 LAB — CBC
HCT: 39.8 % (ref 39.0–52.0)
Hemoglobin: 14 g/dL (ref 13.0–17.0)
MCH: 30.6 pg (ref 26.0–34.0)
MCHC: 35.2 g/dL (ref 30.0–36.0)
MCV: 86.9 fL (ref 78.0–100.0)
PLATELETS: 214 10*3/uL (ref 150–400)
RBC: 4.58 MIL/uL (ref 4.22–5.81)
RDW: 12.9 % (ref 11.5–15.5)
WBC: 8.7 10*3/uL (ref 4.0–10.5)

## 2014-10-16 LAB — I-STAT TROPONIN, ED: Troponin i, poc: 0 ng/mL (ref 0.00–0.08)

## 2014-10-16 LAB — CBG MONITORING, ED: Glucose-Capillary: 108 mg/dL — ABNORMAL HIGH (ref 65–99)

## 2014-10-16 MED ORDER — SODIUM CHLORIDE 0.9 % IV BOLUS (SEPSIS)
1000.0000 mL | Freq: Once | INTRAVENOUS | Status: AC
Start: 2014-10-16 — End: 2014-10-16
  Administered 2014-10-16: 1000 mL via INTRAVENOUS

## 2014-10-16 MED ORDER — ALBUTEROL SULFATE HFA 108 (90 BASE) MCG/ACT IN AERS
2.0000 | INHALATION_SPRAY | Freq: Once | RESPIRATORY_TRACT | Status: AC
  Administered 2014-10-16: 2 via RESPIRATORY_TRACT
  Filled 2014-10-16: qty 6.7

## 2014-10-16 NOTE — ED Notes (Signed)
PA at BS.  

## 2014-10-16 NOTE — ED Provider Notes (Signed)
CSN: [id number]     Arrival date & time 10/16/14  1930 History   First MD Initiated Contact with Patient 10/16/14 2024     Chief Complaint  Patient presents with  . Loss of Consciousness     (Consider location/radiation/quality/duration/timing/severity/associated sxs/prior Treatment) HPI Gregory Mcdowell is a 64 y.o. male history of asthma who comes in for evaluation of loss consciousness. Patient states approximately 6 PM, he was at a wedding, sitting down working his [employer] when he became very hot and sweaty and felt like he was going to "pass out". He reports feeling dizzy and remember being lowered to the floor, but denies losing consciousness. He took his [employer] off and remembers feeling better after 10 or 15 minutes of rest. Denies any associated nausea or vomiting, chest pain, shortness of breath, numbness or unilateral weakness. He also reports he has only had one glass of water today, but has had a [employer] and a [employer]. No other active or modifying factors. Patient denies any discomfort at this time.  Past Medical History  Diagnosis Date  . Asthma    Past Surgical History  Procedure Laterality Date  . Leg injury     No family history on file. History  Substance Use Topics  . Smoking status: Never Smoker   . Smokeless tobacco: Never Used  . Alcohol Use: Yes     Comment: ocassionally    Review of Systems A 10 point review of systems was completed and was negative except for pertinent positives and negatives as mentioned in the history of present illness     Allergies  Review of patient's allergies indicates no known allergies.  Home Medications   Prior to Admission medications   Medication Sig Start Date End Date Taking? Authorizing Provider  montelukast (SINGULAIR) 10 MG tablet Take 10 mg by mouth at bedtime.  01/06/14  Yes Historical Provider, MD  Multiple Vitamin (MULTIVITAMIN WITH MINERALS) TABS tablet Take 1 tablet by mouth daily.   Yes Historical Provider, MD   naproxen (NAPROSYN) 500 MG tablet Take 1 tablet (500 mg total) by mouth 2 (two) times daily as needed for mild pain, moderate pain or headache (TAKE WITH MEALS.). 01/17/14   Mercedes Camprubi-Soms, PA-C  oxyCODONE-acetaminophen (PERCOCET) 5-325 MG per tablet Take 1-2 tablets by mouth every 6 (six) hours as needed for severe pain. 01/17/14   Mercedes Camprubi-Soms, PA-C   BP 116/68 mmHg  Pulse 60  Temp(Src) 97.6 F (36.4 C) (Oral)  Resp 15  SpO2 96% Physical Exam  Constitutional: He is oriented to person, place, and time. He appears well-developed and well-nourished.  HENT:  Head: Normocephalic and atraumatic.  Mouth/Throat: Oropharynx is clear and moist.  Eyes: Conjunctivae are normal. Pupils are equal, round, and reactive to light. Right eye exhibits no discharge. Left eye exhibits no discharge. No scleral icterus.  Neck: Neck supple.  Cardiovascular: Normal rate, regular rhythm and normal heart sounds.   Pulmonary/Chest: Effort normal and breath sounds normal. No respiratory distress. He has no wheezes. He has no rales.  Abdominal: Soft. There is no tenderness.  Musculoskeletal: He exhibits no tenderness.  Neurological: He is alert and oriented to person, place, and time.  Cranial Nerves II-XII grossly intact. Moves all extremities without ataxia. Motor and sensation are baseline.  Skin: Skin is warm and dry. No rash noted.  Psychiatric: He has a normal mood and affect.  Nursing note and vitals reviewed.   ED Course  Procedures (including critical care time) Labs Review Labs  Reviewed  BASIC METABOLIC PANEL - Abnormal; Notable for the following:    Glucose, Bld 116 (*)    Calcium 8.6 (*)    All other components within normal limits  CBG MONITORING, ED - Abnormal; Notable for the following:    Glucose-Capillary 108 (*)    All other components within normal limits  CBC  I-STAT TROPOININ, ED    Imaging Review Dg Chest 2 View  10/16/2014   CLINICAL DATA:  Syncope.  EXAM: CHEST   2 VIEW  COMPARISON:  01/03/2007  FINDINGS: The heart size and mediastinal contours are within normal limits. Both lungs are clear. The visualized skeletal structures are unremarkable.  IMPRESSION: Normal exam.   Electronically Signed   By: Francene BoyersJames  Maxwell M.D.   On: 10/16/2014 21:31     EKG Interpretation None     Meds given in ED:  Medications  sodium chloride 0.9 % bolus 1,000 mL (0 mLs Intravenous Stopped 10/16/14 2143)  albuterol (PROVENTIL HFA;VENTOLIN HFA) 108 (90 BASE) MCG/ACT inhaler 2 puff (2 puffs Inhalation Given 10/16/14 2248)    Discharge Medication List as of 10/16/2014 10:32 PM     Filed Vitals:   10/16/14 2200 10/16/14 2215 10/16/14 2230 10/16/14 2244  BP: 119/73 113/68 116/68 116/68  Pulse: 60 59 57 60  Temp:      TempSrc:      Resp: 20 20 23 15   SpO2: 96% 96% 97% 96%    MDM  Vitals stable - WNL -afebrile Pt resting comfortably in ED. patient remains completely asymptomatic in the ED. PE--physical exam is grossly benign. Normal lung and heart examination. Labwork--troponin negative, EKG is reassuring. Labs otherwise noncontributory. Imaging--chest x-ray shows no acute cardiopulmonary pathology.  DDX--patient dizziness likely secondary to heat exhaustion. Discussed appropriate hydration strategies and avoidance of extreme heat. No evidence of other acute or emergent pathology at this time. Patient states he feels much better and is ready to go home. I discussed all relevant lab findings and imaging results with pt and they verbalized understanding. Discussed f/u with PCP within 48 hrs and return precautions, pt very amenable to plan. Prior to patient discharge, I discussed and reviewed this case with Dr. Effie ShyWentz    Final diagnoses:  Dizziness        Joycie PeekBenjamin Arend Bahl, PA-C 10/17/14 0000  Mancel BaleElliott Wentz, MD 10/17/14 [id number]

## 2014-10-16 NOTE — ED Notes (Signed)
Pt was outside at a wedding, wearing a blazer, pt reports he got very hot and lightheaded and EMS reports pt had a witnessed syncopal episode and was assisted to the ground, pt became alert and was helped up and had another syncopal episode (same situation, assisted to the ground). Wife reported each episode was 5-10 seconds. PT had little water today and soda throughout the day. Pt denies any sx at this time.

## 2014-10-16 NOTE — ED Notes (Signed)
Ben PA at bedside. 

## 2014-10-16 NOTE — Discharge Instructions (Signed)
You were evaluated in the ED today for your dizziness. There is not appear to be an emergent cause her symptoms at this time. Your exam, labs, EKG and chest x-ray are all reassuring. It is important to follow-up with primary care for further evaluation and management of your symptoms. Return to ED for worsening symptoms.  Dizziness Dizziness is a common problem. It is a feeling of unsteadiness or light-headedness. You may feel like you are about to faint. Dizziness can lead to injury if you stumble or fall. A person of any age group can suffer from dizziness, but dizziness is more common in older adults. CAUSES  Dizziness can be caused by many different things, including:  Middle ear problems.  Standing for too long.  Infections.  An allergic reaction.  Aging.  An emotional response to something, such as the sight of blood.  Side effects of medicines.  Tiredness.  Problems with circulation or blood pressure.  Excessive use of alcohol or medicines, or illegal drug use.  Breathing too fast (hyperventilation).  An irregular heart rhythm (arrhythmia).  A low red blood cell count (anemia).  Pregnancy.  Vomiting, diarrhea, fever, or other illnesses that cause body fluid loss (dehydration).  Diseases or conditions such as Parkinson's disease, high blood pressure (hypertension), diabetes, and thyroid problems.  Exposure to extreme heat. DIAGNOSIS  Your health care provider will ask about your symptoms, perform a physical exam, and perform an electrocardiogram (ECG) to record the electrical activity of your heart. Your health care provider may also perform other heart or blood tests to determine the cause of your dizziness. These may include:  Transthoracic echocardiogram (TTE). During echocardiography, sound waves are used to evaluate how blood flows through your heart.  Transesophageal echocardiogram (TEE).  Cardiac monitoring. This allows your health care provider to monitor  your heart rate and rhythm in real time.  Holter monitor. This is a portable device that records your heartbeat and can help diagnose heart arrhythmias. It allows your health care provider to track your heart activity for several days if needed.  Stress tests by exercise or by giving medicine that makes the heart beat faster. TREATMENT  Treatment of dizziness depends on the cause of your symptoms and can vary greatly. HOME CARE INSTRUCTIONS   Drink enough fluids to keep your urine clear or pale yellow. This is especially important in very hot weather. In older adults, it is also important in cold weather.  Take your medicine exactly as directed if your dizziness is caused by medicines. When taking blood pressure medicines, it is especially important to get up slowly.  Rise slowly from chairs and steady yourself until you feel okay.  In the morning, first sit up on the side of the bed. When you feel okay, stand slowly while holding onto something until you know your balance is fine.  Move your legs often if you need to stand in one place for a long time. Tighten and relax your muscles in your legs while standing.  Have someone stay with you for 1-2 days if dizziness continues to be a problem. Do this until you feel you are well enough to stay alone. Have the person call your health care provider if he or she notices changes in you that are concerning.  Do not drive or use heavy machinery if you feel dizzy.  Do not drink alcohol. SEEK IMMEDIATE MEDICAL CARE IF:   Your dizziness or light-headedness gets worse.  You feel nauseous or vomit.  You  have problems talking, walking, or using your arms, hands, or legs.  You feel weak.  You are not thinking clearly or you have trouble forming sentences. It may take a friend or family member to notice this.  You have chest pain, abdominal pain, shortness of breath, or sweating.  Your vision changes.  You notice any bleeding.  You have  side effects from medicine that seems to be getting worse rather than better. MAKE SURE YOU:   Understand these instructions.  Will watch your condition.  Will get help right away if you are not doing well or get worse. Document Released: 10/23/2000 Document Revised: 05/04/2013 Document Reviewed: 11/16/2010 [facility] Patient Information 2015 [place], [place]. This information is not intended to replace advice given to you by your health care provider. Make sure you discuss any questions you have with your health care provider.

## 2014-10-16 NOTE — ED Provider Notes (Signed)
Medical screening examination/treatment/procedure(s) were performed by non-physician practitioner and as supervising physician I was immediately available for consultation/collaboration.   EKG Interpretation None          Date: 19:35- 10/16/14. MUSE hyperlink is inactive  Rate: 59  Rhythm: sinus bradycardia  QRS Axis: right  PR and QT Intervals: normal  ST/T Wave abnormalities: normal  PR and QRS Conduction Disutrbances:none  Narrative Interpretation:   Old EKG Reviewed: changes noted- axis is shifted   Mancel BaleElliott Chanoch Mccleery, MD 10/17/14 0010

## 2015-09-17 ENCOUNTER — Emergency Department (HOSPITAL_COMMUNITY)
Admission: EM | Admit: 2015-09-17 | Discharge: 2015-09-17 | Discharge status: Absent without leave | Payer: Commercial Managed Care - PPO

## 2015-10-07 IMAGING — CR DG KNEE COMPLETE 4+V*R*
4 series · 4 of 4 positions shown · non-contrast
Comparison: None.

CLINICAL DATA: Right knee popping sensation

EXAM:
RIGHT KNEE - COMPLETE 4+ VIEW

[t knee ap right]
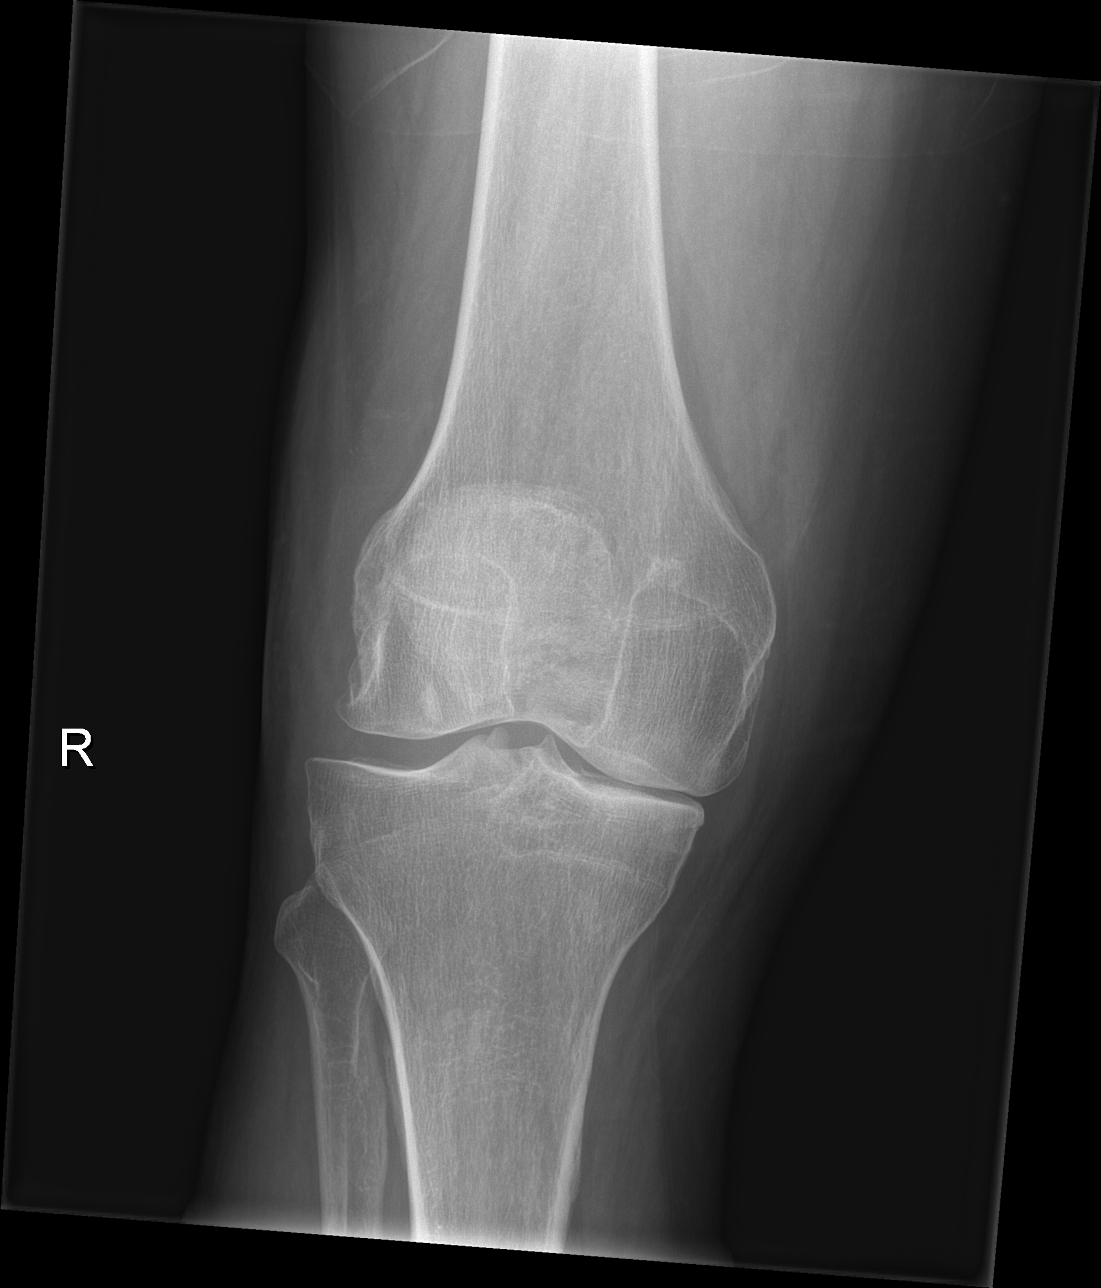

[t knee obl right (1 of 2)]
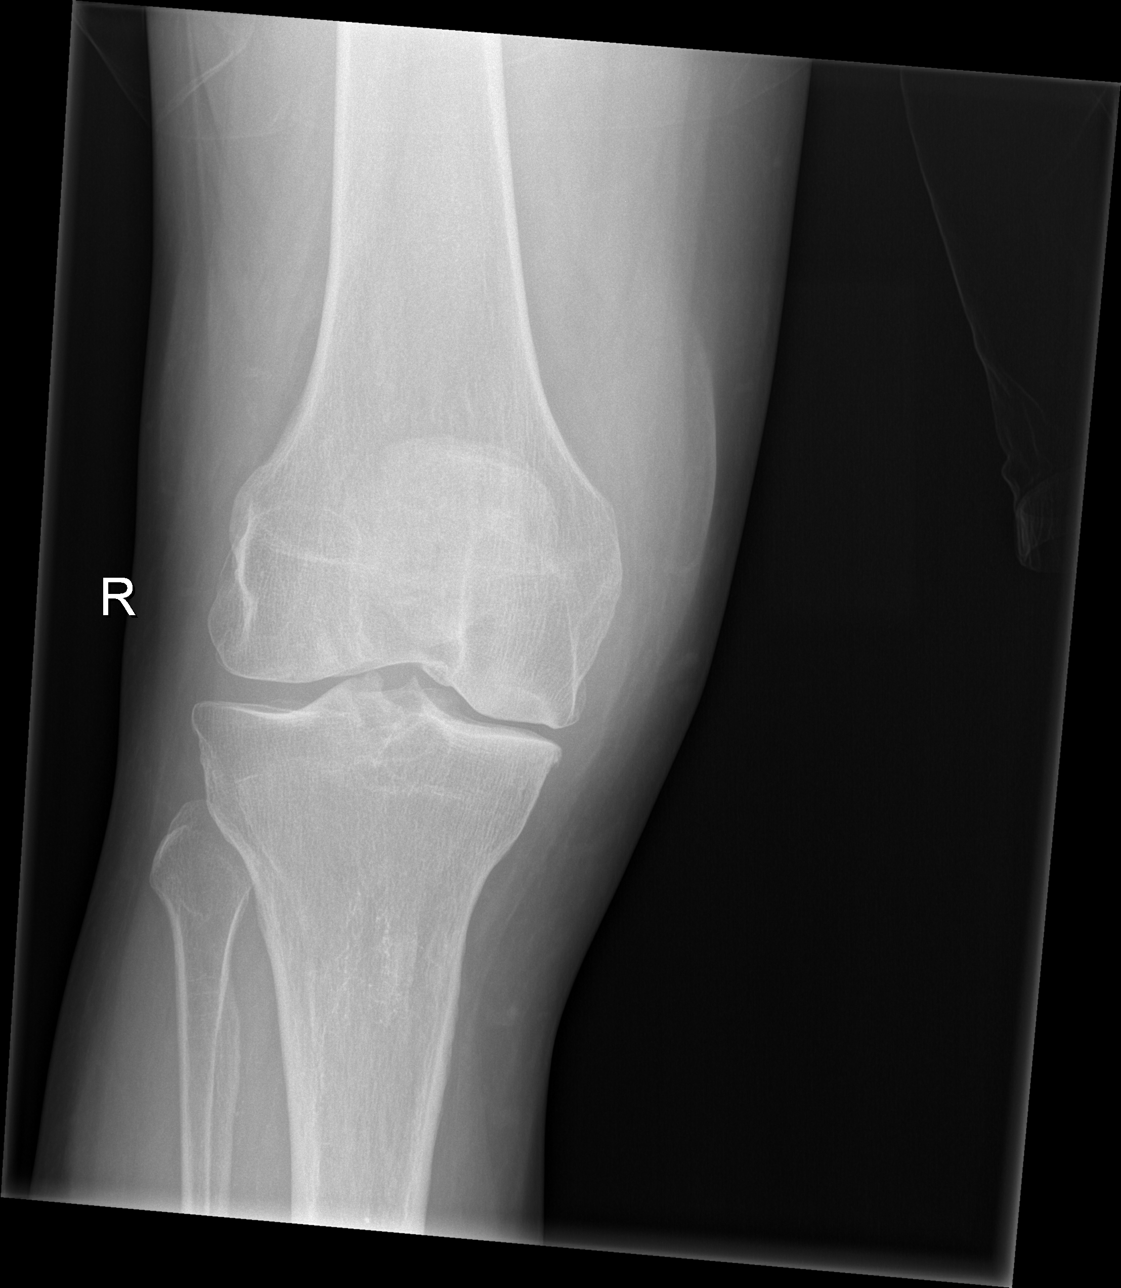

[t knee obl right (2 of 2)]
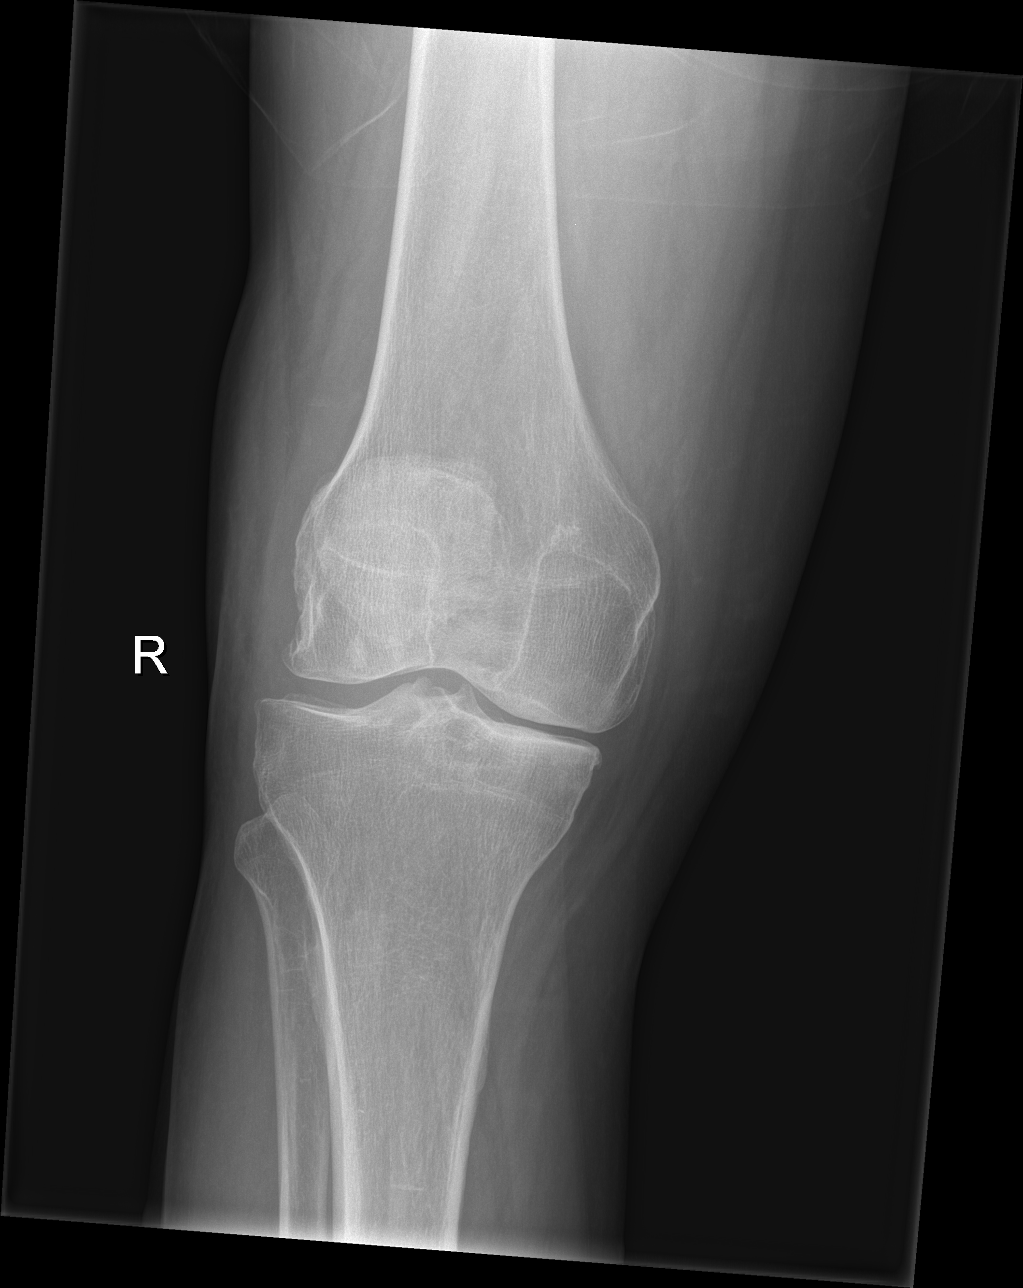

[t knee lat right]
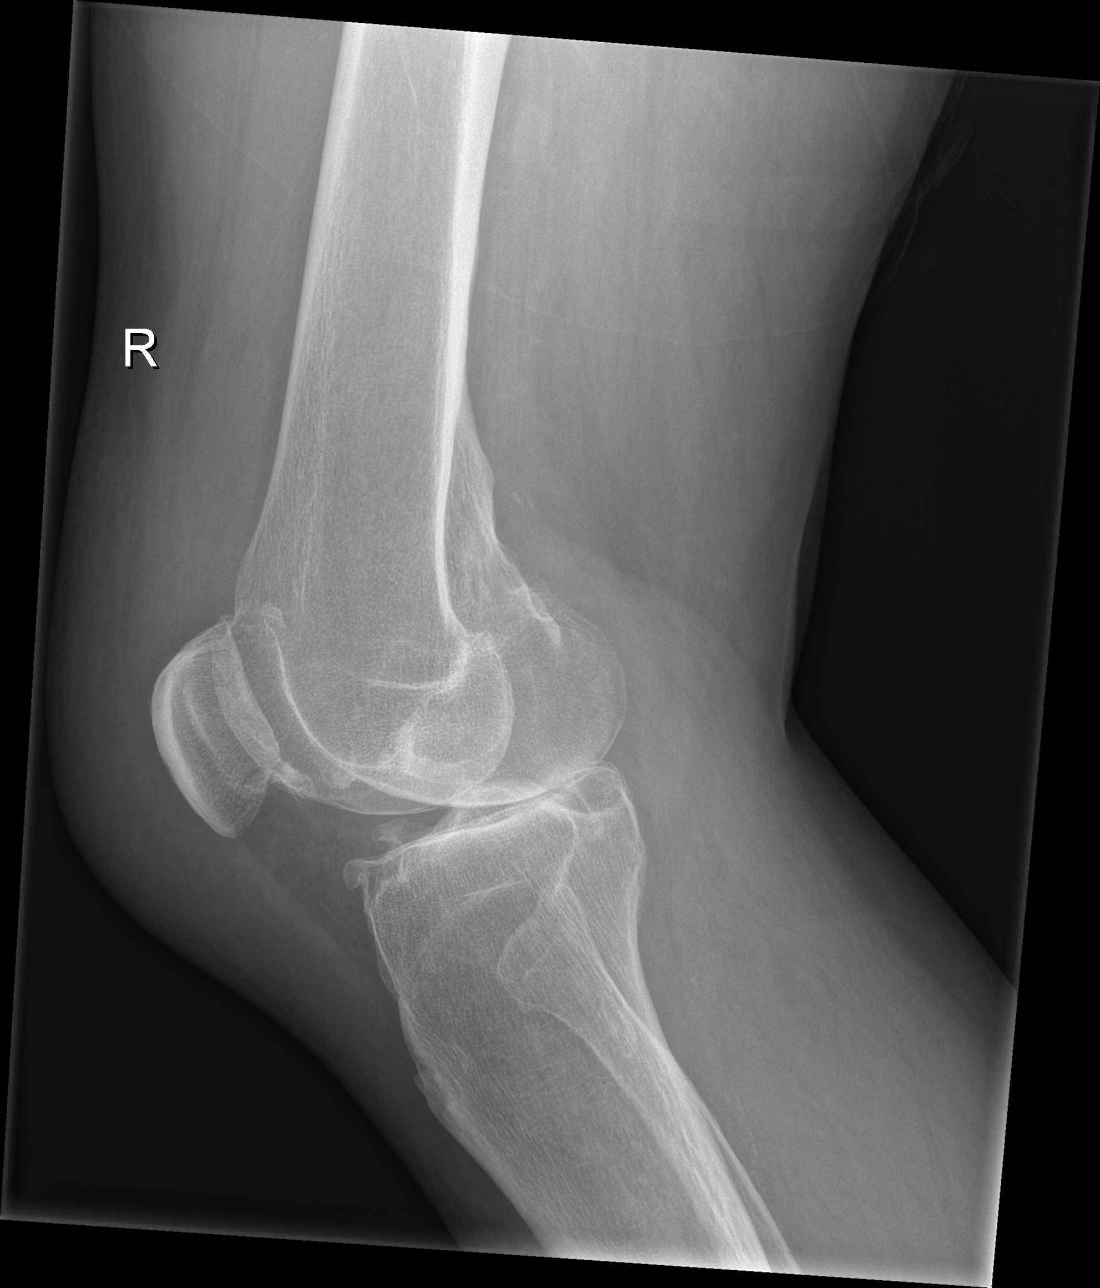

[4 of 4 positions shown; findings below may reference images not displayed]

FINDINGS: There is no evidence of fracture or dislocation. There is no
evidence of arthropathy or other focal bone abnormality. Soft
tissues are unremarkable. Mild tricompartmental degenerative change
is noted. Trace suprapatellar fluid is identified.
IMPRESSION: No acute osseous abnormality.

## 2016-03-13 ENCOUNTER — Ambulatory Visit
Admission: RE | Admit: 2016-03-13 | Discharge: 2016-03-13 | Disposition: A | Discharge status: Home / Self Care | Payer: Commercial Managed Care - PPO | Source: Ambulatory Visit | Attending: Family Medicine | Admitting: Family Medicine

## 2016-03-13 ENCOUNTER — Other Ambulatory Visit: Payer: Self-pay | Admitting: Family Medicine

## 2016-03-13 DIAGNOSIS — R05 Cough: Secondary | ICD-10-CM

## 2016-03-13 DIAGNOSIS — R059 Cough, unspecified: Secondary | ICD-10-CM

## 2016-04-12 DIAGNOSIS — J189 Pneumonia, unspecified organism: Secondary | ICD-10-CM

## 2016-04-12 HISTORY — DX: Pneumonia, unspecified organism: J18.9

## 2016-08-24 ENCOUNTER — Emergency Department (HOSPITAL_COMMUNITY)
Admission: EM | Admit: 2016-08-24 | Discharge: 2016-08-24 | Disposition: A | Discharge status: Home / Self Care | Payer: Commercial Managed Care - PPO | Attending: Emergency Medicine | Admitting: Emergency Medicine

## 2016-08-24 ENCOUNTER — Emergency Department (HOSPITAL_COMMUNITY): Payer: Commercial Managed Care - PPO

## 2016-08-24 ENCOUNTER — Encounter (HOSPITAL_COMMUNITY): Payer: Self-pay | Admitting: Emergency Medicine

## 2016-08-24 DIAGNOSIS — Y929 Unspecified place or not applicable: Secondary | ICD-10-CM | POA: Diagnosis not present

## 2016-08-24 DIAGNOSIS — W11XXXA Fall on and from ladder, initial encounter: Secondary | ICD-10-CM | POA: Insufficient documentation

## 2016-08-24 DIAGNOSIS — Z7982 Long term (current) use of aspirin: Secondary | ICD-10-CM | POA: Diagnosis not present

## 2016-08-24 DIAGNOSIS — S59192A Other physeal fracture of upper end of radius, left arm, initial encounter for closed fracture: Secondary | ICD-10-CM | POA: Insufficient documentation

## 2016-08-24 DIAGNOSIS — Y999 Unspecified external cause status: Secondary | ICD-10-CM | POA: Insufficient documentation

## 2016-08-24 DIAGNOSIS — Z79899 Other long term (current) drug therapy: Secondary | ICD-10-CM | POA: Insufficient documentation

## 2016-08-24 DIAGNOSIS — J45909 Unspecified asthma, uncomplicated: Secondary | ICD-10-CM | POA: Diagnosis not present

## 2016-08-24 DIAGNOSIS — Y939 Activity, unspecified: Secondary | ICD-10-CM | POA: Insufficient documentation

## 2016-08-24 DIAGNOSIS — S42202A Unspecified fracture of upper end of left humerus, initial encounter for closed fracture: Secondary | ICD-10-CM

## 2016-08-24 DIAGNOSIS — S4991XA Unspecified injury of right shoulder and upper arm, initial encounter: Secondary | ICD-10-CM | POA: Diagnosis present

## 2016-08-24 MED ORDER — OXYCODONE-ACETAMINOPHEN 5-325 MG PO TABS
2.0000 | ORAL_TABLET | Freq: Once | ORAL | Status: AC
  Administered 2016-08-24: 2 via ORAL
  Filled 2016-08-24: qty 2

## 2016-08-24 MED ORDER — OXYCODONE-ACETAMINOPHEN 5-325 MG PO TABS: 1.0000 | ORAL_TABLET | ORAL | 0 refills | Status: DC | PRN

## 2016-08-24 NOTE — ED Triage Notes (Signed)
Patient presents today with complaints of left shoulder pain. Patient fell off 8 ft ladder and landed on left side. Patient denies any LOC ,patient denies the use of blood thinners. Deformity noted the left shoulder. Patient rates pain 4/10 on arrival. Pulse intake, Cap refill >2 Sensation intake patient able to move fingers. Patient give 150 mcg of fentanyl  with EMS

## 2016-08-24 NOTE — Progress Notes (Signed)
Orthopedic Tech Progress Note Patient Details:  Gregory Mcdowell 1950/07/23 [id number]  Ortho Devices Type of Ortho Device: Sling immobilizer Ortho Device/Splint Interventions: Ordered, Application   Jennye Moccasin 08/24/2016, 4:28 PM

## 2016-08-25 NOTE — ED Provider Notes (Signed)
[facility]-EMERGENCY DEPT Provider Note   CSN: [id number] Arrival date & time: 08/24/16  1351     History   Chief Complaint Chief Complaint  Patient presents with  . Fall    HPI Gregory Mcdowell is a 66 y.o. male. Patient fell off the ladder today while working near the roof injuring his left shoulder.  Denies head injury.  Denies neck pain.  Denies chest pain or abdominal pain.  He reports landing on his left side and presents with obvious deformity to his left shoulder.  He denies weakness or numbness of his left hand.  Pain is moderate to severe in severity and worse with movement and palpation of his left shoulder.  Denies back pain.  Denies weakness of his arms or legs.  Patient given 150 g of fentanyl by EMS prior to arrival.      Past Medical History:  Diagnosis Date  . Asthma     There are no active problems to display for this patient.   Past Surgical History:  Procedure Laterality Date  . leg injury         Home Medications    Prior to Admission medications   Medication Sig Start Date End Date Taking? Authorizing Provider  ASPIRIN PO Take 2 tablets by mouth once.   Yes Historical Provider, MD  montelukast (SINGULAIR) 10 MG tablet Take 10 mg by mouth at bedtime.  01/06/14  Yes Historical Provider, MD  Multiple Vitamin (MULTIVITAMIN WITH MINERALS) TABS tablet Take 1 tablet by mouth daily.   Yes Historical Provider, MD  SYMBICORT 160-4.5 MCG/ACT inhaler Inhale 2 puffs into the lungs 2 (two) times daily. 07/05/16  Yes Historical Provider, MD  naproxen (NAPROSYN) 500 MG tablet Take 1 tablet (500 mg total) by mouth 2 (two) times daily as needed for mild pain, moderate pain or headache (TAKE WITH MEALS.). Patient not taking: Reported on 08/24/2016 01/17/14   Mercedes Street, PA-C  oxyCODONE-acetaminophen (PERCOCET/ROXICET) 5-325 MG tablet Take 1 tablet by mouth every 4 (four) hours as needed for severe pain. 08/24/16   Azalia Bilis, MD    Family History No family history on  file.  Social History Social History  Substance Use Topics  . Smoking status: Never Smoker  . Smokeless tobacco: Never Used  . Alcohol use Yes     Comment: ocassionally     Allergies   Patient has no known allergies.   Review of Systems Review of Systems  All other systems reviewed and are negative.    Physical Exam Updated Vital Signs BP 118/80   Pulse 63   Temp 97.8 F (36.6 C) (Oral)   Resp 18   SpO2 98%   Physical Exam  Constitutional: He is oriented to person, place, and time. He appears well-developed and well-nourished.  HENT:  Head: Normocephalic and atraumatic.  Eyes: EOM are normal.  Neck: Normal range of motion. Neck supple.  No C-spine tenderness.  Cardiovascular: Normal rate, regular rhythm, normal heart sounds and intact distal pulses.   Pulmonary/Chest: Effort normal and breath sounds normal. No respiratory distress.  Abdominal: Soft. He exhibits no distension. There is no tenderness. There is no guarding.  Musculoskeletal: Normal range of motion.  No thoracic or lumbar tenderness.  Full range of motion bilateral hips, knees, ankles.  Full range of motion bilateral wrists, elbows.  Full range of motion of right shoulder.  Limited range of motion of left shoulder secondary to pain.  Obvious deformity of the left shoulder with developing left anterior  shoulder hematoma.  Normal left radial pulse  Neurological: He is alert and oriented to person, place, and time.  Skin: Skin is warm and dry.  Psychiatric: He has a normal mood and affect. Judgment normal.  Nursing note and vitals reviewed.    ED Treatments / Results  Labs (all labs ordered are listed, but only abnormal results are displayed) Labs Reviewed - No data to display  EKG  EKG Interpretation None       Radiology Dg Shoulder Left  Result Date: 08/24/2016 CLINICAL DATA:  Fall.  Left shoulder pain and deformity EXAM: LEFT SHOULDER - 2+ VIEW COMPARISON:  None. FINDINGS: Comminuted left  proximal humerus fracture involving the surgical neck and greater tuberosity with 1 shaft's with anterior displacement of the dominant distal fracture fragment (approximately 3-4 cm anterior displacement) and mild overriding of the fracture fragments. No additional fracture. No dislocation of the left humeral head at the left glenohumeral joint. No evidence of left acromioclavicular joint separation. No appreciable degenerative arthropathy. No suspicious focal osseous lesion. IMPRESSION: Comminuted displaced left proximal humerus fracture involving the surgical neck and greater tuberosity as detailed. Electronically Signed   By: Delbert Phenix M.D.   On: 08/24/2016 15:14    Procedures Procedures (including critical care time)  Medications Ordered in ED Medications  oxyCODONE-acetaminophen (PERCOCET/ROXICET) 5-325 MG per tablet 2 tablet (2 tablets Oral Given 08/24/16 1631)     Initial Impression / Assessment and Plan / ED Course  I have reviewed the triage vital signs and the nursing notes.  Pertinent labs & imaging results that were available during my care of the patient were reviewed by me and considered in my medical decision making (see chart for details).     Left proximal humerus fracture.  Patient will likely need operative repair.  Placed in a sling with orthopedic follow-up.  C-spine cleared by Nexus criteria.  No head injury.  Repeat abdominal exam without tenderness.  Discharge home with pain medication.  Patient understands return to the ER for new or worsening symptoms  Final Clinical Impressions(s) / ED Diagnoses   Final diagnoses:  Closed fracture of proximal end of left humerus, unspecified fracture morphology, initial encounter    New Prescriptions Discharge Medication List as of 08/24/2016  4:11 PM       Azalia Bilis, MD 08/25/16 1934

## 2016-08-26 ENCOUNTER — Encounter (INDEPENDENT_AMBULATORY_CARE_PROVIDER_SITE_OTHER): Payer: Self-pay | Admitting: Orthopaedic Surgery

## 2016-08-26 ENCOUNTER — Ambulatory Visit (INDEPENDENT_AMBULATORY_CARE_PROVIDER_SITE_OTHER): Payer: Commercial Managed Care - PPO | Admitting: Orthopaedic Surgery

## 2016-08-26 DIAGNOSIS — S42292A Other displaced fracture of upper end of left humerus, initial encounter for closed fracture: Secondary | ICD-10-CM | POA: Diagnosis not present

## 2016-08-26 MED ORDER — HYDROCODONE-ACETAMINOPHEN 10-325 MG PO TABS: 1.0000 | ORAL_TABLET | Freq: Four times a day (QID) | ORAL | 0 refills | Status: DC | PRN

## 2016-08-26 NOTE — Progress Notes (Signed)
Office Visit Note   Patient: Gregory Mcdowell           Date of Birth: 02-Feb-1951           MRN: [id number] Visit Date: 08/26/2016              Requested by: No referring provider defined for this encounter. PCP: No PCP Per Patient   Assessment & Plan: Visit Diagnoses:  1. Closed 4-part fracture of proximal humerus, left, initial encounter     Plan: X-rays showed a significantly displaced surgical neck proximal humerus fracture with anterior displacement. Patient is very active and still works as a Consulting civil engineer at the [employer]. Hydrocodone was prescribed. We discussed recommendation of ORIF of his injury to maximize function and healing. He understands the risks benefits alternatives to surgery including infection, neurovascular injury, stiffness. He understands and wishes to proceed. We will plan on doing this on Wednesday. I would like to have him get a peripheral nerve block if he is a candidate. Anticipate that the patient will be out of work for about 3 months.  Follow-Up Instructions: Return for 2 week postop visit.   Orders:  No orders of the defined types were placed in this encounter.  Meds ordered this encounter  Medications  . HYDROcodone-acetaminophen (NORCO) 10-325 MG tablet    Sig: Take 1-2 tablets by mouth every 6 (six) hours as needed for moderate pain or severe pain.    Dispense:  40 tablet    Refill:  0      Procedures: No procedures performed   Clinical Data: No additional findings.   Subjective: Chief Complaint  Patient presents with  . Left Shoulder - Pain    Patient is a very pleasant 66 year old gentleman who is right-hand dominant who fell from a ladder this weekend and sustained a left proximal humerus fracture he is in significant pain with any movement of the arm. He has swelling and bruising around the shoulder. He denies any numbness or tingling. Oxycodone is not giving him significant relief. The pain does not  radiate.    Review of Systems  Constitutional: Negative.   All other systems reviewed and are negative.    Objective: Vital Signs: There were no vitals taken for this visit.  Physical Exam  Constitutional: He is oriented to person, place, and time. He appears well-developed and well-nourished.  HENT:  Head: Normocephalic and atraumatic.  Eyes: Pupils are equal, round, and reactive to light.  Neck: Neck supple.  Pulmonary/Chest: Effort normal.  Abdominal: Soft.  Musculoskeletal: Normal range of motion.  Neurological: He is alert and oriented to person, place, and time.  Skin: Skin is warm.  Psychiatric: He has a normal mood and affect. His behavior is normal. Judgment and thought content normal.  Nursing note and vitals reviewed.   Ortho Exam Left shoulder exam shows moderate swelling with bruising. There is no skin lesions. He is neurovascularly intact distally. Range of motion deferred. Specialty Comments:  No specialty comments available.  Imaging: No results found.   PMFS History: Patient Active Problem List   Diagnosis Date Noted  . Closed 4-part fracture of proximal humerus, left, initial encounter 08/26/2016   Past Medical History:  Diagnosis Date  . Asthma     No family history on file.  Past Surgical History:  Procedure Laterality Date  . leg injury     Social History   Occupational History  . Not on file.   Social History Main Topics  .  Smoking status: Never Smoker  . Smokeless tobacco: Never Used  . Alcohol use Yes     Comment: ocassionally  . Drug use: No  . Sexual activity: Not on file

## 2016-08-27 ENCOUNTER — Other Ambulatory Visit (INDEPENDENT_AMBULATORY_CARE_PROVIDER_SITE_OTHER): Payer: Self-pay | Admitting: Orthopaedic Surgery

## 2016-08-27 ENCOUNTER — Encounter (HOSPITAL_COMMUNITY): Payer: Self-pay | Admitting: *Deleted

## 2016-08-27 DIAGNOSIS — S42202A Unspecified fracture of upper end of left humerus, initial encounter for closed fracture: Secondary | ICD-10-CM

## 2016-08-28 ENCOUNTER — Encounter (HOSPITAL_COMMUNITY)
Admission: RE | Disposition: A | Discharge status: Home / Self Care | Payer: Self-pay | Source: Ambulatory Visit | Attending: Orthopaedic Surgery

## 2016-08-28 ENCOUNTER — Ambulatory Visit (HOSPITAL_COMMUNITY): Payer: Commercial Managed Care - PPO | Admitting: Anesthesiology

## 2016-08-28 ENCOUNTER — Ambulatory Visit (HOSPITAL_COMMUNITY)
Admission: RE | Admit: 2016-08-28 | Discharge: 2016-08-28 | Disposition: A | Discharge status: Home / Self Care | Payer: Commercial Managed Care - PPO | Source: Ambulatory Visit | Attending: Orthopaedic Surgery | Admitting: Orthopaedic Surgery

## 2016-08-28 ENCOUNTER — Ambulatory Visit (HOSPITAL_COMMUNITY): Payer: Commercial Managed Care - PPO

## 2016-08-28 ENCOUNTER — Encounter (HOSPITAL_COMMUNITY): Payer: Self-pay | Admitting: *Deleted

## 2016-08-28 DIAGNOSIS — Y939 Activity, unspecified: Secondary | ICD-10-CM | POA: Insufficient documentation

## 2016-08-28 DIAGNOSIS — W19XXXA Unspecified fall, initial encounter: Secondary | ICD-10-CM | POA: Insufficient documentation

## 2016-08-28 DIAGNOSIS — S42242A 4-part fracture of surgical neck of left humerus, initial encounter for closed fracture: Secondary | ICD-10-CM | POA: Insufficient documentation

## 2016-08-28 DIAGNOSIS — S46122A Laceration of muscle, fascia and tendon of long head of biceps, left arm, initial encounter: Secondary | ICD-10-CM | POA: Diagnosis not present

## 2016-08-28 DIAGNOSIS — Z79899 Other long term (current) drug therapy: Secondary | ICD-10-CM | POA: Diagnosis not present

## 2016-08-28 DIAGNOSIS — Z87442 Personal history of urinary calculi: Secondary | ICD-10-CM | POA: Insufficient documentation

## 2016-08-28 DIAGNOSIS — X58XXXA Exposure to other specified factors, initial encounter: Secondary | ICD-10-CM | POA: Diagnosis not present

## 2016-08-28 DIAGNOSIS — J45909 Unspecified asthma, uncomplicated: Secondary | ICD-10-CM | POA: Insufficient documentation

## 2016-08-28 DIAGNOSIS — S42292A Other displaced fracture of upper end of left humerus, initial encounter for closed fracture: Secondary | ICD-10-CM | POA: Diagnosis not present

## 2016-08-28 DIAGNOSIS — S42202A Unspecified fracture of upper end of left humerus, initial encounter for closed fracture: Secondary | ICD-10-CM

## 2016-08-28 DIAGNOSIS — Z419 Encounter for procedure for purposes other than remedying health state, unspecified: Secondary | ICD-10-CM

## 2016-08-28 HISTORY — DX: Personal history of urinary calculi: Z87.442

## 2016-08-28 HISTORY — DX: Pneumonia, unspecified organism: J18.9

## 2016-08-28 HISTORY — PX: ORIF HUMERUS FRACTURE: SHX2126

## 2016-08-28 LAB — CBC
HCT: 32.3 % — ABNORMAL LOW (ref 39.0–52.0)
HEMOGLOBIN: 11 g/dL — AB (ref 13.0–17.0)
MCH: 29.9 pg (ref 26.0–34.0)
MCHC: 34.1 g/dL (ref 30.0–36.0)
MCV: 87.8 fL (ref 78.0–100.0)
PLATELETS: 292 10*3/uL (ref 150–400)
RBC: 3.68 MIL/uL — ABNORMAL LOW (ref 4.22–5.81)
RDW: 13.3 % (ref 11.5–15.5)
WBC: 7.6 10*3/uL (ref 4.0–10.5)

## 2016-08-28 SURGERY — OPEN REDUCTION INTERNAL FIXATION (ORIF) PROXIMAL HUMERUS FRACTURE
Anesthesia: General | Site: Shoulder | Laterality: Left

## 2016-08-28 MED ORDER — PROPOFOL 10 MG/ML IV BOLUS
INTRAVENOUS | Status: AC
  Filled 2016-08-28: qty 20

## 2016-08-28 MED ORDER — SENNOSIDES-DOCUSATE SODIUM 8.6-50 MG PO TABS: 1.0000 | ORAL_TABLET | Freq: Every evening | ORAL | 1 refills | Status: DC | PRN

## 2016-08-28 MED ORDER — CEFAZOLIN SODIUM-DEXTROSE 2-4 GM/100ML-% IV SOLN
INTRAVENOUS | Status: AC
  Filled 2016-08-28: qty 100

## 2016-08-28 MED ORDER — METHOCARBAMOL 750 MG PO TABS: 750.0000 mg | ORAL_TABLET | Freq: Two times a day (BID) | ORAL | 0 refills | Status: DC | PRN

## 2016-08-28 MED ORDER — LIDOCAINE 2% (20 MG/ML) 5 ML SYRINGE
INTRAMUSCULAR | Status: AC
  Filled 2016-08-28: qty 5

## 2016-08-28 MED ORDER — CEFAZOLIN SODIUM-DEXTROSE 2-4 GM/100ML-% IV SOLN
2.0000 g | INTRAVENOUS | Status: AC
  Administered 2016-08-28: 2 g via INTRAVENOUS

## 2016-08-28 MED ORDER — ROCURONIUM BROMIDE 50 MG/5ML IV SOSY
PREFILLED_SYRINGE | INTRAVENOUS | Status: AC
  Filled 2016-08-28: qty 10

## 2016-08-28 MED ORDER — SUGAMMADEX SODIUM 200 MG/2ML IV SOLN
INTRAVENOUS | Status: DC | PRN
  Administered 2016-08-28: 200 mg via INTRAVENOUS

## 2016-08-28 MED ORDER — DEXAMETHASONE SODIUM PHOSPHATE 10 MG/ML IJ SOLN
INTRAMUSCULAR | Status: DC | PRN
  Administered 2016-08-28: 10 mg via INTRAVENOUS

## 2016-08-28 MED ORDER — SUCCINYLCHOLINE CHLORIDE 200 MG/10ML IV SOSY
PREFILLED_SYRINGE | INTRAVENOUS | Status: DC | PRN
  Administered 2016-08-28: 120 mg via INTRAVENOUS

## 2016-08-28 MED ORDER — OXYCODONE HCL ER 10 MG PO T12A: 10.0000 mg | EXTENDED_RELEASE_TABLET | Freq: Two times a day (BID) | ORAL | 0 refills | Status: DC

## 2016-08-28 MED ORDER — BUPIVACAINE-EPINEPHRINE (PF) 0.5% -1:200000 IJ SOLN
INTRAMUSCULAR | Status: DC | PRN
  Administered 2016-08-28: 30 mL via PERINEURAL

## 2016-08-28 MED ORDER — ONDANSETRON HCL 4 MG/2ML IJ SOLN
INTRAMUSCULAR | Status: DC | PRN
  Administered 2016-08-28: 4 mg via INTRAVENOUS

## 2016-08-28 MED ORDER — HYDROMORPHONE HCL 1 MG/ML IJ SOLN: 0.2500 mg | INTRAMUSCULAR | Status: DC | PRN

## 2016-08-28 MED ORDER — ROCURONIUM BROMIDE 10 MG/ML (PF) SYRINGE
PREFILLED_SYRINGE | INTRAVENOUS | Status: DC | PRN
Start: 2016-08-28 — End: 2016-08-30
  Administered 2016-08-28: 50 mg via INTRAVENOUS

## 2016-08-28 MED ORDER — LACTATED RINGERS IV SOLN
INTRAVENOUS | Status: DC
  Administered 2016-08-28: 13:00:00 via INTRAVENOUS

## 2016-08-28 MED ORDER — PROMETHAZINE HCL 25 MG PO TABS: 25.0000 mg | ORAL_TABLET | Freq: Four times a day (QID) | ORAL | 1 refills | Status: DC | PRN

## 2016-08-28 MED ORDER — FENTANYL CITRATE (PF) 250 MCG/5ML IJ SOLN
INTRAMUSCULAR | Status: AC
  Filled 2016-08-28: qty 5

## 2016-08-28 MED ORDER — DEXAMETHASONE SODIUM PHOSPHATE 10 MG/ML IJ SOLN
INTRAMUSCULAR | Status: AC
  Filled 2016-08-28: qty 1

## 2016-08-28 MED ORDER — MIDAZOLAM HCL 2 MG/2ML IJ SOLN
INTRAMUSCULAR | Status: AC
  Administered 2016-08-28: 1 mg
  Filled 2016-08-28: qty 2

## 2016-08-28 MED ORDER — LACTATED RINGERS IV SOLN
INTRAVENOUS | Status: DC | PRN
Start: 2016-08-28 — End: 2016-08-30
  Administered 2016-08-28 (×2): via INTRAVENOUS

## 2016-08-28 MED ORDER — EPHEDRINE SULFATE-NACL 50-0.9 MG/10ML-% IV SOSY
PREFILLED_SYRINGE | INTRAVENOUS | Status: DC | PRN
  Administered 2016-08-28 (×2): 10 mg via INTRAVENOUS

## 2016-08-28 MED ORDER — DEXTROSE 5 % IV SOLN
INTRAVENOUS | Status: DC | PRN
  Administered 2016-08-28: 50 ug/min via INTRAVENOUS

## 2016-08-28 MED ORDER — ONDANSETRON HCL 4 MG/2ML IJ SOLN
INTRAMUSCULAR | Status: AC
  Filled 2016-08-28: qty 2

## 2016-08-28 MED ORDER — PROMETHAZINE HCL 25 MG/ML IJ SOLN: 6.2500 mg | INTRAMUSCULAR | Status: DC | PRN

## 2016-08-28 MED ORDER — PROPOFOL 10 MG/ML IV BOLUS
INTRAVENOUS | Status: DC | PRN
  Administered 2016-08-28: 250 mg via INTRAVENOUS

## 2016-08-28 MED ORDER — 0.9 % SODIUM CHLORIDE (POUR BTL) OPTIME
TOPICAL | Status: DC | PRN
  Administered 2016-08-28: 1000 mL

## 2016-08-28 MED ORDER — LIDOCAINE 2% (20 MG/ML) 5 ML SYRINGE
INTRAMUSCULAR | Status: DC | PRN
  Administered 2016-08-28: 20 mg via INTRAVENOUS

## 2016-08-28 MED ORDER — OXYCODONE HCL 5 MG PO TABS: 5.0000 mg | ORAL_TABLET | ORAL | 0 refills | Status: DC | PRN

## 2016-08-28 MED ORDER — ONDANSETRON HCL 4 MG PO TABS: 4.0000 mg | ORAL_TABLET | Freq: Three times a day (TID) | ORAL | 0 refills | Status: DC | PRN

## 2016-08-28 MED ORDER — FENTANYL CITRATE (PF) 100 MCG/2ML IJ SOLN
INTRAMUSCULAR | Status: AC
  Administered 2016-08-28: 50 ug
  Filled 2016-08-28: qty 2

## 2016-08-28 MED ORDER — FENTANYL CITRATE (PF) 100 MCG/2ML IJ SOLN
INTRAMUSCULAR | Status: DC | PRN
  Administered 2016-08-28: 50 ug via INTRAVENOUS

## 2016-08-28 SURGICAL SUPPLY — 74 items
BIT DRILL 3.2 (BIT) ×2
BIT DRILL 3.2XCALB NS DISP (BIT) ×1 IMPLANT
BIT DRILL CALIBRATED 2.7 (BIT) ×2 IMPLANT
BIT DRILL CALIBRATED 2.7MM (BIT) ×1
BIT DRL 3.2XCALB NS DISP (BIT) ×1
CLOSURE WOUND 1/2 X4 (GAUZE/BANDAGES/DRESSINGS) ×1
COVER MAYO STAND STRL (DRAPES) ×3 IMPLANT
COVER SURGICAL LIGHT HANDLE (MISCELLANEOUS) ×3 IMPLANT
DRAPE C-ARM 42X72 X-RAY (DRAPES) ×3 IMPLANT
DRAPE IMP U-DRAPE 54X76 (DRAPES) ×3 IMPLANT
DRAPE INCISE IOBAN 66X45 STRL (DRAPES) ×3 IMPLANT
DRAPE ORTHO SPLIT 77X108 STRL (DRAPES) ×4
DRAPE SURG ORHT 6 SPLT 77X108 (DRAPES) ×2 IMPLANT
DRAPE U-SHAPE 47X51 STRL (DRAPES) ×3 IMPLANT
DRSG EMULSION OIL 3X3 NADH (GAUZE/BANDAGES/DRESSINGS) ×3 IMPLANT
DRSG TEGADERM 4X4.75 (GAUZE/BANDAGES/DRESSINGS) ×9 IMPLANT
DURAPREP 26ML APPLICATOR (WOUND CARE) ×3 IMPLANT
ELECT NEEDLE BLADE 2-5/6 (NEEDLE) ×3 IMPLANT
ELECT REM PT RETURN 9FT ADLT (ELECTROSURGICAL) ×3
ELECTRODE REM PT RTRN 9FT ADLT (ELECTROSURGICAL) ×1 IMPLANT
GAUZE SPONGE 4X4 12PLY STRL (GAUZE/BANDAGES/DRESSINGS) ×3 IMPLANT
GAUZE SPONGE 4X4 16PLY XRAY LF (GAUZE/BANDAGES/DRESSINGS) ×3 IMPLANT
GLOVE BIOGEL PI ORTHO PRO 7.5 (GLOVE) ×2
GLOVE BIOGEL PI ORTHO PRO SZ8 (GLOVE) ×2
GLOVE ORTHO TXT STRL SZ7.5 (GLOVE) ×3 IMPLANT
GLOVE PI ORTHO PRO STRL 7.5 (GLOVE) ×1 IMPLANT
GLOVE PI ORTHO PRO STRL SZ8 (GLOVE) ×1 IMPLANT
GLOVE SURG ORTHO 8.5 STRL (GLOVE) ×3 IMPLANT
GOWN STRL REUS W/ TWL XL LVL3 (GOWN DISPOSABLE) ×2 IMPLANT
GOWN STRL REUS W/TWL XL LVL3 (GOWN DISPOSABLE) ×4
K-WIRE 2X5 SS THRDED S3 (WIRE) ×3
KIT BASIN OR (CUSTOM PROCEDURE TRAY) ×3 IMPLANT
KIT ROOM TURNOVER OR (KITS) ×3 IMPLANT
KWIRE 2X5 SS THRDED S3 (WIRE) ×1 IMPLANT
MANIFOLD NEPTUNE II (INSTRUMENTS) ×3 IMPLANT
NEEDLE HYPO 25GX1X1/2 BEV (NEEDLE) IMPLANT
NS IRRIG 1000ML POUR BTL (IV SOLUTION) ×3 IMPLANT
PACK SHOULDER (CUSTOM PROCEDURE TRAY) ×3 IMPLANT
PAD ABD 8X10 STRL (GAUZE/BANDAGES/DRESSINGS) ×3 IMPLANT
PAD ARMBOARD 7.5X6 YLW CONV (MISCELLANEOUS) ×6 IMPLANT
PEG LOCKING 3.2MMX44 (Peg) ×3 IMPLANT
PEG LOCKING 3.2MMX46 (Peg) ×3 IMPLANT
PEG LOCKING 3.2MMX54 (Peg) ×3 IMPLANT
PEG LOCKING 3.2MMX56 (Peg) ×3 IMPLANT
PEG LOCKING 3.2MMX65 (Peg) ×6 IMPLANT
PEG LOCKING 3.2X 60MM (Peg) ×3 IMPLANT
PEG LOCKING 3.2X 70MM (Peg) ×3 IMPLANT
PEG LOCKING 3.2X40 (Peg) ×3 IMPLANT
PEG LOCKING 3.2X50 (Screw) ×6 IMPLANT
PLATE PROX HUMERUS HI LT 4H (Plate) ×3 IMPLANT
PUTTY DBM STAGRAFT PLUS 5CC (Putty) ×3 IMPLANT
SCREW CORTICAL LOW PROF 3.5X32 (Screw) ×3 IMPLANT
SCREW LOW PROF TIS 3.5X28MM (Screw) ×3 IMPLANT
SCREW LOW PROFILE 3.5X30MM TIS (Screw) ×3 IMPLANT
SLING ARM FOAM STRAP LRG (SOFTGOODS) ×3 IMPLANT
SPONGE LAP 4X18 X RAY DECT (DISPOSABLE) ×6 IMPLANT
STRIP CLOSURE SKIN 1/2X4 (GAUZE/BANDAGES/DRESSINGS) ×2 IMPLANT
SUCTION FRAZIER HANDLE 10FR (MISCELLANEOUS) ×2
SUCTION TUBE FRAZIER 10FR DISP (MISCELLANEOUS) ×1 IMPLANT
SUT ETHILON 3 0 PS 1 (SUTURE) ×3 IMPLANT
SUT FIBERWIRE #2 38 T-5 BLUE (SUTURE)
SUT MNCRL AB 4-0 PS2 18 (SUTURE) ×3 IMPLANT
SUT VIC AB 0 CT1 27 (SUTURE) ×4
SUT VIC AB 0 CT1 27XBRD ANBCTR (SUTURE) ×2 IMPLANT
SUT VIC AB 2-0 CT1 27 (SUTURE) ×4
SUT VIC AB 2-0 CT1 TAPERPNT 27 (SUTURE) ×2 IMPLANT
SUTURE FIBERWR #2 38 T-5 BLUE (SUTURE) IMPLANT
SYR CONTROL 10ML LL (SYRINGE) ×3 IMPLANT
TOWEL OR 17X24 6PK STRL BLUE (TOWEL DISPOSABLE) ×3 IMPLANT
TOWEL OR 17X26 10 PK STRL BLUE (TOWEL DISPOSABLE) ×3 IMPLANT
TUBE CONNECTING 20'X1/4 (TUBING) ×1
TUBE CONNECTING 20X1/4 (TUBING) ×2 IMPLANT
WATER STERILE IRR 1000ML POUR (IV SOLUTION) ×3 IMPLANT
YANKAUER SUCT BULB TIP NO VENT (SUCTIONS) ×3 IMPLANT

## 2016-08-28 NOTE — Anesthesia Procedure Notes (Signed)
Anesthesia Regional Block: Interscalene brachial plexus block   Pre-Anesthetic Checklist: ,, timeout performed, Correct Patient, Correct Site, Correct Laterality, Correct Procedure, Correct Position, site marked, Risks and benefits discussed,  Surgical consent,  Pre-op evaluation,  At surgeon's request and post-op pain management  Laterality: Left  Prep: chloraprep       Needles:  Injection technique: Single-shot  Needle Type: Echogenic Needle     Needle Length: 9cm  Needle Gauge: 21     Additional Needles:   Procedures: ultrasound guided, nerve stimulator,,,,,,   Nerve Stimulator or Paresthesia:  Response: deltoid and biceps, 0.5 mA,   Additional Responses:   Narrative:  Start time: 08/28/2016 3:50 PM End time: 08/28/2016 3:58 PM Injection made incrementally with aspirations every 5 mL.  Performed by: Personally  Anesthesiologist: Marcene Duos
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
## 2016-08-28 NOTE — Transfer of Care (Signed)
Immediate Anesthesia Transfer of Care Note  Patient: Gregory Mcdowell  Procedure(s) Performed: Procedure(s): OPEN REDUCTION INTERNAL FIXATION (ORIF) LEFT PROXIMAL HUMERUS FRACTURE (Left)  Patient Location: PACU  Anesthesia Type:General  Level of Consciousness: awake, alert , oriented and patient cooperative  Airway & Oxygen Therapy: Patient Spontanous Breathing and Patient connected to nasal cannula oxygen  Post-op Assessment: Report given to RN and Post -op Vital signs reviewed and stable  Post vital signs: Reviewed and stable  Last Vitals:  Vitals:   08/28/16 1318  BP: 126/60  Pulse: 64  Resp: 18  Temp: 36.9 C    Last Pain:  Vitals:   08/28/16 1318  TempSrc: Oral  PainSc:       Patients Stated Pain Goal: 1 (08/28/16 1303)  Complications: No apparent anesthesia complications

## 2016-08-28 NOTE — Discharge Instructions (Signed)
Postoperative instructions:  Weightbearing instructions: non weight bearing.  Wear sling at all times  Keep your dressing and/or splint clean and dry at all times.  You can remove your dressing on post-operative day #3 and change with a dry/sterile dressing or Band-Aids as needed thereafter.     Ice:  Place intermittent ice or cooler pack over your shoulder, 30 minutes on and 30 minutes off.  Continue this for the first 72 hours after surgery, then save ice for use after therapy sessions or on more active days.    Dressing:  Perform 1st dressing change at 3 days postoperative. A moderate amount of blood tinged drainage is to be expected.  So if you bleed through the dressing on the first or second day or if you have fevers, it is fine to change the dressing/check the wounds early and redress wound.  If it bleeds through again, or if the incisions are leaking frank blood, please call the office. May change dressing every 1-2 days thereafter to help watch wounds. Can purchase Tegaderm (or 41M Nexcare) water resistant dressings at local pharmacy / [facility].   Pain control:  You have been given a prescription to be taken as directed for post-operative pain control.  In addition, elevate the operative extremity above the heart at all times to prevent swelling and throbbing pain.  Take over-the-counter Colace,  by mouth twice a day while taking narcotic pain medications to help prevent constipation.  Follow up appointments: 1) 10-14 days for suture removal and wound check. 2) Dr. Roda Shutters as scheduled.   -------------------------------------------------------------------------------------------------------------  After Surgery Pain Control:  After your surgery, post-surgical discomfort or pain is likely. This discomfort can last several days to a few weeks. At certain times of the day your discomfort may be more intense.  Did you receive a nerve block?  A nerve block can provide pain relief for one  hour to two days after your surgery. As long as the nerve block is working, you will experience little or no sensation in the area the surgeon operated on.  As the nerve block wears off, you will begin to experience pain or discomfort. It is very important that you begin taking your prescribed pain medication before the nerve block fully wears off. Treating your pain at the first sign of the block wearing off will ensure your pain is better controlled and more tolerable when full-sensation returns. Do not wait until the pain is intolerable, as the medicine will be less effective. It is better to treat pain in advance than to try and catch up.  General Anesthesia:  If you did not receive a nerve block during your surgery, you will need to start taking your pain medication shortly after your surgery and should continue to do so as prescribed by your surgeon.  Pain Medication:  Most commonly we prescribe Vicodin and Percocet for post-operative pain. Both of these medications contain a combination of acetaminophen (Tylenol) and a narcotic to help control pain.   It takes between 30 and 45 minutes before pain medication starts to work. It is important to take your medication before your pain level gets too intense.   Nausea is a common side effect of many pain medications. You will want to eat something before taking your pain medicine to help prevent nausea.   If you are taking a prescription pain medication that contains acetaminophen, we recommend that you do not take additional over the counter acetaminophen (Tylenol).  Other pain relieving options:  Using a cold pack to ice the affected area a few times a day (15 to 20 minutes at a time) can help to relieve pain, reduce swelling and bruising.   Elevation of the affected area can also help to reduce pain and swelling.       General Anesthesia, Adult, Care After These instructions provide you with information about caring for yourself after  your procedure. Your health care provider may also give you more specific instructions. Your treatment has been planned according to current medical practices, but problems sometimes occur. Call your health care provider if you have any problems or questions after your procedure. What can I expect after the procedure? After the procedure, it is common to have:  Vomiting.  A sore throat.  Mental slowness. It is common to feel:  Nauseous.  Cold or shivery.  Sleepy.  Tired.  Sore or achy, even in parts of your body where you did not have surgery. Follow these instructions at home: For at least 24 hours after the procedure:   Do not:  Participate in activities where you could fall or become injured.  Drive.  Use heavy machinery.  Drink alcohol.  Take sleeping pills or medicines that cause drowsiness.  Make important decisions or sign legal documents.  Take care of children on your own.  Rest. Eating and drinking   If you vomit, drink water, juice, or soup when you can drink without vomiting.  Drink enough fluid to keep your urine clear or pale yellow.  Make sure you have little or no nausea before eating solid foods.  Follow the diet recommended by your health care provider. General instructions   Have a responsible adult stay with you until you are awake and alert.  Return to your normal activities as told by your health care provider. Ask your health care provider what activities are safe for you.  Take over-the-counter and prescription medicines only as told by your health care provider.  If you smoke, do not smoke without supervision.  Keep all follow-up visits as told by your health care provider. This is important. Contact a health care provider if:  You continue to have nausea or vomiting at home, and medicines are not helpful.  You cannot drink fluids or start eating again.  You cannot urinate after 8-12 hours.  You develop a skin rash.  You  have fever.  You have increasing redness at the site of your procedure. Get help right away if:  You have difficulty breathing.  You have chest pain.  You have unexpected bleeding.  You feel that you are having a life-threatening or urgent problem. This information is not intended to replace advice given to you by your health care provider. Make sure you discuss any questions you have with your health care provider. Document Released: 08/05/2000 Document Revised: 10/02/2015 Document Reviewed: 04/13/2015 Elsevier Interactive Patient Education  2017 [employer].

## 2016-08-28 NOTE — Op Note (Signed)
   Date of Surgery: 08/28/2016  INDICATIONS: Gregory Mcdowell is a 66 y.o.-year-old male with a left surgical neck proximal humerus fracture;  The patient did consent to the procedure after discussion of the risks and benefits.  PREOPERATIVE DIAGNOSIS: Left displaced 4-part proximal humerus fracture  POSTOPERATIVE DIAGNOSIS: Same.  PROCEDURE:  1. Open reduction internal fixation of left 4 part proximal humerus fracture 2. Biceps tenodesis, open  SURGEON: N. Glee Arvin, M.D.  ASSIST: April Chilton Si, RNFA.  ANESTHESIA:  general, regional  IV FLUIDS AND URINE: See anesthesia.  ESTIMATED BLOOD LOSS: 500 mL.  IMPLANTS: Biomet  DRAINS: None  COMPLICATIONS: None.  DESCRIPTION OF PROCEDURE: The patient was brought to the operating room and placed supine on the operating table.  The patient had been signed prior to the procedure and this was documented. The patient had the anesthesia placed by the anesthesiologist.  A time-out was performed to confirm that this was the correct patient, site, side and location. The patient did receive antibiotics prior to the incision and was re-dosed during the procedure as needed at indicated intervals.  The patient had the operative extremity prepped and draped in the standard surgical fashion.    A standard deltopectoral approach was used. Blunt dissection was carried down to the proximal humerus fracture. There was extensive comminution and fracture of the greater and lesser tuberosity from the humeral head. The biceps tendon was entrapped in the fracture and was severely lacerated. The biceps tendon was tagged for later tenodesis. After this was done the rotator interval was released to gain exposure. Subdeltoid space was elevated off of the proximal humerus. Retractors were placed for exposure. The fracture was then reduced. There was significant comminution and bone loss. With the use of fluoroscopy and with the fracture reduced the lateral plate was placed at the  appropriate position. This was then provisionally pinned with a K wire. Nonlocking screws were placed through the humeral shaft. With the humeral head reduced locking pegs were placed through the plate into the humeral head giving good subchondral support. Once this was done fluoroscopy was used to confirm no intra-articular penetration of the screws. The proximal humerus moved as a single unit. The lesser and greater tuberosities were sutured down to the plate using #2 fiber wire. I then performed a biceps tenodesis. The wound was then thoroughly irrigated with 3 L of normal saline. The wound was closed in layer fashion using 2-0 Vicryl and 3-0 nylon. Sterile dressings were applied. Shoulder was placed in a shoulder immobilizer. Patient tolerated procedure well and no immediate competitions.  POSTOPERATIVE PLAN: Patient will be discharged home. He is to wear the sling at all times.  Gregory Reel, MD [facility] [phone] 7:12 PM

## 2016-08-28 NOTE — Anesthesia Postprocedure Evaluation (Addendum)
Anesthesia Post Note  Patient: Gregory Mcdowell  Procedure(s) Performed: Procedure(s) (LRB): OPEN REDUCTION INTERNAL FIXATION (ORIF) LEFT PROXIMAL HUMERUS FRACTURE (Left)  Patient location during evaluation: PACU Anesthesia Type: General and Regional Level of consciousness: awake and alert Pain management: pain level controlled Vital Signs Assessment: post-procedure vital signs reviewed and stable Respiratory status: spontaneous breathing, nonlabored ventilation, respiratory function stable and patient connected to nasal cannula oxygen Cardiovascular status: blood pressure returned to baseline and stable Postop Assessment: no signs of nausea or vomiting Anesthetic complications: no       Last Vitals:  Vitals:   08/28/16 1915 08/28/16 1930  BP:  (!) 89/61  Pulse: 75 80  Resp: (!) 25 15  Temp:      Last Pain:  Vitals:   08/28/16 1930  TempSrc:   PainSc: 0-No pain                 Asenath Balash S

## 2016-08-28 NOTE — Anesthesia Preprocedure Evaluation (Addendum)
Anesthesia Evaluation  Patient identified by MRN, date of birth, ID band Patient awake    Reviewed: Allergy & Precautions, NPO status , Patient's Chart, lab work & pertinent test results  Airway Mallampati: II  TM Distance: >3 FB     Dental  (+) Dental Advisory Given   Pulmonary asthma ,    breath sounds clear to auscultation       Cardiovascular negative cardio ROS   Rhythm:Regular Rate:Normal     Neuro/Psych negative neurological ROS     GI/Hepatic negative GI ROS, Neg liver ROS,   Endo/Other  negative endocrine ROS  Renal/GU negative Renal ROS     Musculoskeletal   Abdominal   Peds  Hematology negative hematology ROS (+)   Anesthesia Other Findings   Reproductive/Obstetrics                            Lab Results  Component Value Date   WBC 7.6 08/28/2016   HGB 11.0 (L) 08/28/2016   HCT 32.3 (L) 08/28/2016   MCV 87.8 08/28/2016   PLT 292 08/28/2016   Lab Results  Component Value Date   CREATININE 1.08 10/16/2014   BUN 12 10/16/2014   NA 138 10/16/2014   K 3.7 10/16/2014   CL 103 10/16/2014   CO2 24 10/16/2014    Anesthesia Physical Anesthesia Plan  ASA: II  Anesthesia Plan: General   Post-op Pain Management:  Regional for Post-op pain   Induction: Intravenous  Airway Management Planned: Oral ETT  Additional Equipment:   Intra-op Plan:   Post-operative Plan: Extubation in OR  Informed Consent: I have reviewed the patients History and Physical, chart, labs and discussed the procedure including the risks, benefits and alternatives for the proposed anesthesia with the patient or authorized representative who has indicated his/her understanding and acceptance.   Dental advisory given  Plan Discussed with: CRNA  Anesthesia Plan Comments:         Anesthesia Quick Evaluation

## 2016-08-28 NOTE — Anesthesia Procedure Notes (Signed)
Procedure Name: Intubation Date/Time: 08/28/2016 4:57 PM Performed by: Kyung Rudd Pre-anesthesia Checklist: Patient identified, Emergency Drugs available, Suction available and Patient being monitored Patient Re-evaluated:Patient Re-evaluated prior to inductionOxygen Delivery Method: Circle system utilized Preoxygenation: Pre-oxygenation with 100% oxygen Intubation Type: IV induction and Cricoid Pressure applied Ventilation: Mask ventilation without difficulty and Oral airway inserted - appropriate to patient size Laryngoscope Size: Mac and 4 Grade View: Grade I Tube type: Oral Tube size: 7.5 mm Number of attempts: 1 Airway Equipment and Method: Stylet Placement Confirmation: ETT inserted through vocal cords under direct vision,  positive ETCO2 and breath sounds checked- equal and bilateral Secured at: 22 cm Tube secured with: Tape Dental Injury: Teeth and Oropharynx as per pre-operative assessment

## 2016-08-29 ENCOUNTER — Encounter (HOSPITAL_COMMUNITY): Payer: Self-pay | Admitting: Orthopaedic Surgery

## 2016-08-30 ENCOUNTER — Encounter (HOSPITAL_COMMUNITY): Payer: Self-pay | Admitting: Orthopaedic Surgery

## 2016-09-12 ENCOUNTER — Encounter (INDEPENDENT_AMBULATORY_CARE_PROVIDER_SITE_OTHER): Payer: Self-pay | Admitting: Orthopaedic Surgery

## 2016-09-12 ENCOUNTER — Ambulatory Visit (INDEPENDENT_AMBULATORY_CARE_PROVIDER_SITE_OTHER): Payer: Commercial Managed Care - PPO

## 2016-09-12 ENCOUNTER — Ambulatory Visit (INDEPENDENT_AMBULATORY_CARE_PROVIDER_SITE_OTHER): Payer: Commercial Managed Care - PPO | Admitting: Orthopaedic Surgery

## 2016-09-12 DIAGNOSIS — S42292A Other displaced fracture of upper end of left humerus, initial encounter for closed fracture: Secondary | ICD-10-CM

## 2016-09-12 MED ORDER — OXYCODONE-ACETAMINOPHEN 5-325 MG PO TABS: 1.0000 | ORAL_TABLET | Freq: Two times a day (BID) | ORAL | 0 refills | Status: DC | PRN

## 2016-09-12 NOTE — Progress Notes (Signed)
Gregory Mcdowell comes back today 2 weeks status post ORIF left proximal humerus fracture. Overall he is a lot more comfortable. He takes oxycodone occasionally. He's happy with his progress. On exam his incision has healed. The sutures were removed. He is neurovascularly intact. He x-ray show stable fixation and alignment of the fracture. At this point I want him to continue to wear the shoulder immobilizer and abduction pillow for 2 more weeks at all times. He did have bone loss and severe comminution of the fracture. He can begin very gentle pendulum exercises in another 2 weeks. I will like to see him back in 4 weeks for repeat 2 view x-rays of the left shoulder

## 2016-09-29 ENCOUNTER — Other Ambulatory Visit (INDEPENDENT_AMBULATORY_CARE_PROVIDER_SITE_OTHER): Payer: Self-pay | Admitting: Orthopaedic Surgery

## 2016-10-08 ENCOUNTER — Encounter (HOSPITAL_COMMUNITY): Payer: Self-pay | Admitting: Orthopaedic Surgery

## 2016-10-10 ENCOUNTER — Encounter (INDEPENDENT_AMBULATORY_CARE_PROVIDER_SITE_OTHER): Payer: Self-pay | Admitting: Orthopaedic Surgery

## 2016-10-10 ENCOUNTER — Ambulatory Visit (INDEPENDENT_AMBULATORY_CARE_PROVIDER_SITE_OTHER): Payer: Commercial Managed Care - PPO

## 2016-10-10 ENCOUNTER — Ambulatory Visit (INDEPENDENT_AMBULATORY_CARE_PROVIDER_SITE_OTHER): Payer: Commercial Managed Care - PPO | Admitting: Orthopaedic Surgery

## 2016-10-10 DIAGNOSIS — S42292A Other displaced fracture of upper end of left humerus, initial encounter for closed fracture: Secondary | ICD-10-CM

## 2016-10-10 MED ORDER — ZOLPIDEM TARTRATE 5 MG PO TABS
5.0000 mg | ORAL_TABLET | Freq: Every evening | ORAL | 0 refills | Status: DC | PRN
Start: 2016-10-10 — End: 2016-11-06

## 2016-10-10 MED ORDER — HYDROCODONE-ACETAMINOPHEN 5-325 MG PO TABS: 1.0000 | ORAL_TABLET | Freq: Every evening | ORAL | 0 refills | Status: DC | PRN

## 2016-10-10 NOTE — Progress Notes (Signed)
Mr. Gregory Mcdowell is 6 weeks status post ORIF left proximal humerus fracture. He is overall doing better. He does have some difficulty with throbbing pain and falling asleep at night. He's been working with self on pendulum exercises. He is neurovascularly intact. His surgical scar is fully healed. He is neurovascularly intact. X-ray show stable alignment and evidence of bridging callus formation. He is able to abduct his shoulder to about 45 without difficulty or pain. I refilled his Norco and gave him Ambien to help with sleep. I did tell him not to take them together. Physical therapy referral was also given. I'll see him back on July 6. 2 view x-rays of the left shoulder

## 2016-10-14 NOTE — Addendum Note (Signed)
Addendum  created 10/14/16 1328 by Kieffer Blatz, MD   Sign clinical note    

## 2016-11-06 ENCOUNTER — Other Ambulatory Visit (INDEPENDENT_AMBULATORY_CARE_PROVIDER_SITE_OTHER): Payer: Self-pay | Admitting: Orthopaedic Surgery

## 2016-11-06 NOTE — Telephone Encounter (Signed)
Ok to rf? 

## 2016-11-15 ENCOUNTER — Encounter (INDEPENDENT_AMBULATORY_CARE_PROVIDER_SITE_OTHER): Payer: Self-pay | Admitting: Orthopaedic Surgery

## 2016-11-15 ENCOUNTER — Ambulatory Visit (INDEPENDENT_AMBULATORY_CARE_PROVIDER_SITE_OTHER): Payer: Commercial Managed Care - PPO | Admitting: Orthopaedic Surgery

## 2016-11-15 ENCOUNTER — Ambulatory Visit (INDEPENDENT_AMBULATORY_CARE_PROVIDER_SITE_OTHER): Payer: Commercial Managed Care - PPO

## 2016-11-15 DIAGNOSIS — S42292D Other displaced fracture of upper end of left humerus, subsequent encounter for fracture with routine healing: Secondary | ICD-10-CM

## 2016-11-15 NOTE — Progress Notes (Signed)
Gregory HuaDavid is approximately 11 weeks status post left proximal humerus ORIF. He is doing well overall. He is not taking pain medicines in 3 weeks. He is working with physical therapy and his function is improving. He is able to do yardwork and light work at home. On physical exam for flexion is 90 abduction 110 internal rotation L5. Surgical scar is fully healed. His x-ray show healed proximal humerus fracture with stable fixation. At this point I would like him to begin work conditioning. On releasing him back to modified duty with less than 10 pounds lifting and no overhead work. I will like to see him back in 6 weeks with repeat 2 view x-rays of left shoulder.

## 2016-12-27 ENCOUNTER — Encounter (INDEPENDENT_AMBULATORY_CARE_PROVIDER_SITE_OTHER): Payer: Self-pay | Admitting: Orthopaedic Surgery

## 2016-12-27 ENCOUNTER — Ambulatory Visit (INDEPENDENT_AMBULATORY_CARE_PROVIDER_SITE_OTHER): Payer: Commercial Managed Care - PPO | Admitting: Orthopaedic Surgery

## 2016-12-27 ENCOUNTER — Ambulatory Visit (INDEPENDENT_AMBULATORY_CARE_PROVIDER_SITE_OTHER): Payer: Commercial Managed Care - PPO

## 2016-12-27 DIAGNOSIS — S42292A Other displaced fracture of upper end of left humerus, initial encounter for closed fracture: Secondary | ICD-10-CM
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
## 2018-05-18 IMAGING — RF DG SHOULDER 2+V*L*
1 series · 2 of 2 positions shown · non-contrast
Comparison: Left shoulder radiographs performed 08/24/2016

CLINICAL DATA: Internal fixation of left humerus fracture. Initial
encounter.

EXAM:
DG C-ARM 61-120 MIN; LEFT SHOULDER - 2+ VIEW

[Series 1: run · 2 of 2 slices shown]
[im 1/2]
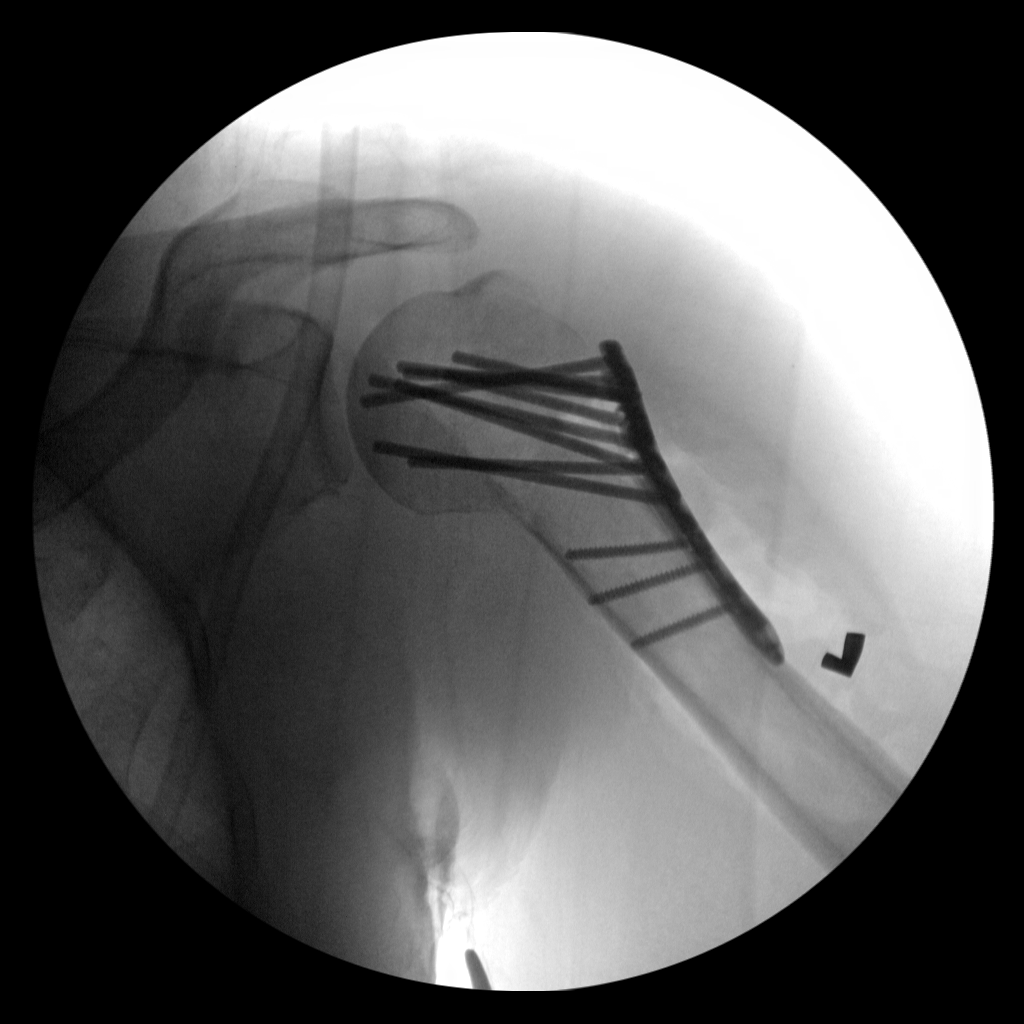
[im 2/2]
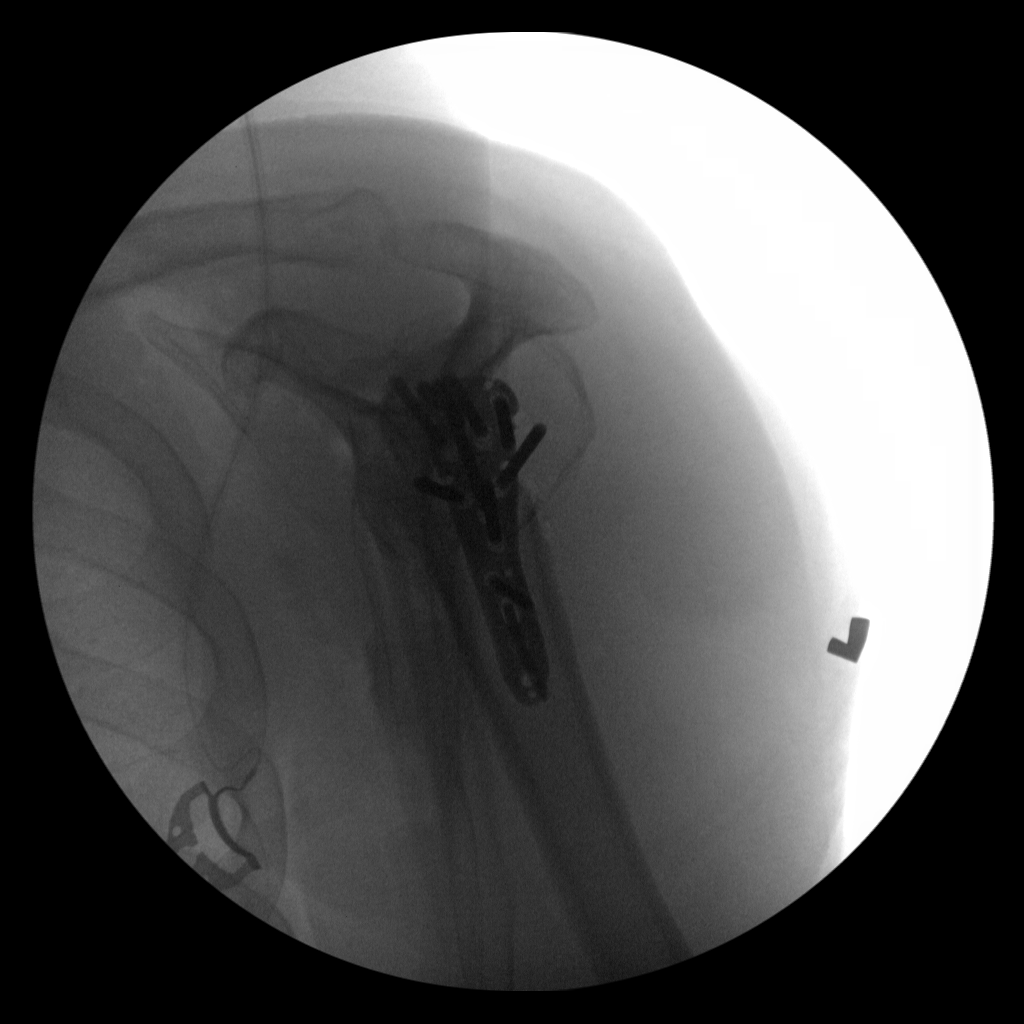

[2 of 2 positions shown; findings below may reference images not displayed]

FINDINGS: Two fluoroscopic C-arm images are provided from the OR,
demonstrating placement of a plate and screws transfixing the left
humeral neck fracture in near anatomic alignment. Overlying
postoperative soft tissue air is seen. The left humeral head remains
seated at the glenoid fossa.
IMPRESSION: Status post internal fixation of left humeral neck fracture in near
anatomic alignment.

## 2023-06-05 ENCOUNTER — Other Ambulatory Visit: Payer: Self-pay | Admitting: Family Medicine

## 2023-06-05 ENCOUNTER — Ambulatory Visit
Admission: RE | Admit: 2023-06-05 | Discharge: 2023-06-05 | Disposition: A | Discharge status: Home / Self Care | Payer: Medicare HMO | Source: Ambulatory Visit | Attending: Family Medicine | Admitting: Family Medicine

## 2023-06-05 DIAGNOSIS — R053 Chronic cough: Secondary | ICD-10-CM

## 2024-01-26 ENCOUNTER — Other Ambulatory Visit: Payer: Self-pay | Admitting: Family Medicine

## 2024-01-26 DIAGNOSIS — R1011 Right upper quadrant pain: Secondary | ICD-10-CM

## 2024-01-27 ENCOUNTER — Other Ambulatory Visit

## 2024-01-30 ENCOUNTER — Ambulatory Visit
Admission: RE | Admit: 2024-01-30 | Discharge: 2024-01-30 | Disposition: A | Discharge status: Home / Self Care | Source: Ambulatory Visit | Attending: Family Medicine | Admitting: Family Medicine

## 2024-01-30 DIAGNOSIS — R1011 Right upper quadrant pain: Secondary | ICD-10-CM

## 2024-03-10 ENCOUNTER — Ambulatory Visit: Admitting: Podiatry

## 2024-03-10 DIAGNOSIS — M65972 Unspecified synovitis and tenosynovitis, left ankle and foot: Secondary | ICD-10-CM | POA: Diagnosis not present

## 2024-03-10 MED ORDER — TRIAMCINOLONE ACETONIDE 40 MG/ML IJ SUSP
40.0000 mg | Freq: Once | INTRAMUSCULAR | Status: AC
  Administered 2024-03-10: 40 mg

## 2024-03-10 NOTE — Progress Notes (Signed)
 Presents with complaint of pain along the medial ankle on the left foot.  Does recall any specific injury.  Hurts first thing in the morning after he starts walking after sitting for a while.  Gets cramping on the medial part of the lower leg.  Has not noticed any swelling or ecchymosis.  No erythema.  No weakness noted.   Physical exam:  General appearance: Pleasant, and in no acute distress. AOx3.  Vascular: Pedal pulses: DP 2/4 bilaterally, PT 2/4 bilaterally.  Mild edema lower legs bilaterally. Capillary fill time immediate bilaterally.  Neurological: Light touch intact feet bilaterally.  Normal Achilles reflex bilaterally.  No clonus or spasticity noted.  Negative Tinel sign tarsal tunnel and porta pedis bilaterally  Dermatologic:   Skin normal temperature bilaterally.  Skin normal color, tone, and texture bilaterally.   Musculoskeletal: Tenderness along the posterior tibial tendon from the medial malleolus proximally about 4 to 5 cm left.  Some tenderness with inversion against resistance with +5 or +5 muscle strength.   Diagnosis: 1.  Posterior tibial tenosynovitis left  Plan: -Discussed with posterior tibial tenosynovitis.  Recommended injection today.  Recommended good stable shoes.  RICE. -injected 3cc 2:1 mixture 0.5 cc Marcaine :Kenolog 10mg /25ml at posterior tibial tendon sheath left taking care not to violate the tendon..    Return 2 weeks follow-up injection PTT left

## 2024-03-29 ENCOUNTER — Ambulatory Visit: Admitting: Podiatry

## 2024-03-29 DIAGNOSIS — M65972 Unspecified synovitis and tenosynovitis, left ankle and foot: Secondary | ICD-10-CM

## 2024-03-29 MED ORDER — TRIAMCINOLONE ACETONIDE 40 MG/ML IJ SUSP
40.0000 mg | Freq: Once | INTRAMUSCULAR | Status: AC
  Administered 2024-03-29: 40 mg

## 2024-03-29 NOTE — Progress Notes (Signed)
 Presents follow-up injection posterior tibial tendon left.  Doing much better By about 50% or more better.  Still sore at same area.  Physical exam:   General appearance: Pleasant, and in no acute distress. AOx3.   Vascular: Pedal pulses: DP 2/4 bilaterally, PT 2/4 bilaterally.  Mild edema lower legs bilaterally. Capillary fill time immediate bilaterally.   Neurological: Light touch intact feet bilaterally.  Normal Achilles reflex bilaterally.  No clonus or spasticity noted.  Negative Tinel sign tarsal tunnel and porta pedis bilaterally   Dermatologic:   Skin normal temperature bilaterally.  Skin normal color, tone, and texture bilaterally.    Musculoskeletal: Tenderness along the posterior tibial tendon from the medial malleolus proximally about 4 to 5 cm left.  Some tenderness with inversion against resistance with +5 or +5 muscle strength.     Diagnosis: 1.  Posterior tibial tenosynovitis left   Plan: -Discussed with posterior tibial tenosynovitis.  Recommended injection today.  Recommended good stable shoes.  RICE. -injected 3cc 2:1 mixture 0.5 cc Marcaine :Kenolog 10mg /50ml at posterior tibial tendon sheath left taking care not to violate the tendon..      Return 2 weeks follow-up injection PTT left

## 2024-04-16 ENCOUNTER — Ambulatory Visit: Admitting: Podiatry

## 2024-04-16 ENCOUNTER — Encounter: Payer: Self-pay | Admitting: Podiatry

## 2024-04-16 DIAGNOSIS — M65972 Unspecified synovitis and tenosynovitis, left ankle and foot: Secondary | ICD-10-CM | POA: Diagnosis not present

## 2024-04-16 DIAGNOSIS — M722 Plantar fascial fibromatosis: Secondary | ICD-10-CM

## 2024-04-16 MED ORDER — PREDNISONE 5 MG PO TABS: ORAL_TABLET | ORAL | 1 refills | Status: AC

## 2024-04-16 NOTE — Progress Notes (Signed)
 Still complaining of pain along the medial arch and medial ankle.  Feeling better after the 2 injections along the posterior tibial tendon.  Still having discomfort however.   Physical exam:  General appearance: Pleasant, and in no acute distress. AOx3.  Vascular: Pedal pulses: DP 2/4 bilaterally, PT 2/4 bilaterally.  Mild edema lower legs bilaterally. Capillary fill time immediate bilaterally.  Neurological: Negative Tinel sign tarsal tunnel porta pedis left  Dermatologic:   Skin normal temperature bilaterally.  Skin normal color, tone, and texture bilaterally.   Musculoskeletal: Tenderness along the plantar fascia.  Tenderness along posterior tibial tendon from the medial malleolus proximally about 4 to 5 cm.  +5/+5 muscle strength in inversion with some tenderness with inversion against resistance  Diagnosis: 1.  Posterior tibial tenosynovitis left. 2.  Plantar fasciitis left.  Plan: -Still tenderness with some improvement.  Will try prednisone  taper and a pneumatic cast offload.. -Dispensed pneumatic walker left - Rx prednisone  5 mg, 30 mg p.o. daily first day, then decrease by 5 mg every other day for 12 days. -Ice 20 minutes an hour several times a day  Return 2 weeks follow-up possible injection left

## 2024-04-30 ENCOUNTER — Ambulatory Visit: Admitting: Podiatry
